# Patient Record
Sex: Female | Born: 1975 | Hispanic: Yes | Marital: Married | Smoking: Never smoker
Health system: Southern US, Community
[De-identification: ages and names within clinical notes are randomized; demographics above are authoritative.]

## PROBLEM LIST (undated history)

## (undated) DIAGNOSIS — O99019 Anemia complicating pregnancy, unspecified trimester: Secondary | ICD-10-CM

## (undated) DIAGNOSIS — O0940 Supervision of pregnancy with grand multiparity, unspecified trimester: Secondary | ICD-10-CM

---
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
## 2004-03-22 ENCOUNTER — Ambulatory Visit: Payer: Self-pay | Admitting: Pediatrics

## 2004-04-04 ENCOUNTER — Ambulatory Visit: Payer: Self-pay | Admitting: Pediatrics

## 2006-05-15 ENCOUNTER — Ambulatory Visit: Payer: Self-pay | Admitting: *Deleted

## 2017-09-24 ENCOUNTER — Ambulatory Visit
Admission: RE | Admit: 2017-09-24 | Discharge: 2017-09-24 | Disposition: A | Discharge status: Home / Self Care | Payer: Self-pay | Source: Ambulatory Visit | Attending: Oncology | Admitting: Oncology

## 2017-09-24 ENCOUNTER — Ambulatory Visit: Payer: Self-pay | Attending: Oncology | Admitting: *Deleted

## 2017-09-24 ENCOUNTER — Other Ambulatory Visit: Payer: Self-pay | Admitting: *Deleted

## 2017-09-24 ENCOUNTER — Encounter (INDEPENDENT_AMBULATORY_CARE_PROVIDER_SITE_OTHER): Payer: Self-pay

## 2017-09-24 VITALS — BP 114/80 | HR 67 | Temp 98.1°F | Ht 64.0 in | Wt 152.0 lb

## 2017-09-24 DIAGNOSIS — N63 Unspecified lump in unspecified breast: Secondary | ICD-10-CM

## 2017-09-24 NOTE — Progress Notes (Signed)
  Subjective:     Patient ID: Leah Banks, female   DOB: 10/26/1975, 42 y.o.   MRN: [id number]  Referred for left breast mass.  Review of Systems     Objective:   Physical Exam  Pulmonary/Chest: Right breast exhibits mass and tenderness. Right breast exhibits no inverted nipple, no nipple discharge and no skin change. Left breast exhibits mass and tenderness. Left breast exhibits no inverted nipple, no nipple discharge and no skin change. No breast swelling, tenderness, discharge or bleeding. Breasts are symmetrical.         Assessment:     42 year old Hispanic female referred to [facility] by Sandrea HughsJessica Rubio, FNP for further evaluation of a 10 cm left breast mass.  Lloyda, the interpreter present during the interview and exam.  Patient's friend, Analena present also per patient request.  Patient states she found the mass on self exam about 2 months ago.  States it is very painful, and more with exertion.  Rates her pain an 8 on a 0/10 scale.  On clinical breast exam there is a palpable firm 2 cm nodule at 10:00 right breast.  The patient states this is tender to palpation.  I can also palpate a very large, very soft, mobile 7 cm mass at 12-2:00 left breast.  This is the area of patient concern.  Taught self breast awareness. Last pap on 06/25/15 was negative without HPV co-testing.  Next pap due in 2020.   Patient has been screened for eligibility.  She does not have any insurance, Medicare or Medicaid.  She also meets financial eligibility.  Hand-out given on the Affordable Care Act.    Plan:     Will get bilateral diagnostic mammogram and ultrasound.  If no findings on imaging will refer for surgical consultation. Will follow-up per [facility] protocol.

## 2017-09-24 NOTE — Patient Instructions (Signed)
Gave patient hand-out, Women Staying Healthy, Active and Well from [facility], with education on breast health, pap smears, heart and colon health. 

## 2017-09-26 ENCOUNTER — Ambulatory Visit
Admission: RE | Admit: 2017-09-26 | Discharge: 2017-09-26 | Disposition: A | Discharge status: Home / Self Care | Payer: Self-pay | Source: Ambulatory Visit | Attending: Oncology | Admitting: Oncology

## 2017-09-26 ENCOUNTER — Encounter: Payer: Self-pay | Admitting: *Deleted

## 2017-09-26 DIAGNOSIS — N63 Unspecified lump in unspecified breast: Secondary | ICD-10-CM | POA: Insufficient documentation

## 2017-09-26 NOTE — Progress Notes (Signed)
Patient returned to the breast center today for cyst aspiration of painful cyst.  Per radiology notes the  cyst decompressed.  Radiology recommends annual screening.  [facility]IS to [place].

## 2018-05-08 ENCOUNTER — Encounter (INDEPENDENT_AMBULATORY_CARE_PROVIDER_SITE_OTHER): Payer: Self-pay

## 2018-05-08 ENCOUNTER — Ambulatory Visit
Admission: RE | Admit: 2018-05-08 | Discharge: 2018-05-08 | Disposition: A | Discharge status: Home / Self Care | Payer: Self-pay | Source: Ambulatory Visit | Attending: Oncology | Admitting: Oncology

## 2018-05-08 ENCOUNTER — Ambulatory Visit: Payer: Self-pay | Attending: Oncology

## 2018-05-08 VITALS — BP 110/70 | HR 70 | Temp 98.3°F | Ht 64.5 in | Wt 149.1 lb

## 2018-05-08 DIAGNOSIS — N63 Unspecified lump in unspecified breast: Secondary | ICD-10-CM

## 2018-05-08 NOTE — Progress Notes (Signed)
  Subjective:     Patient ID: Leah Banks, female   DOB: 07-10-1975, 43 y.o.   MRN: [id number]  HPI   Review of Systems     Objective:   Physical Exam Chest:     Breasts:        Right: Mass and tenderness present. No swelling, bleeding, inverted nipple, nipple discharge or skin change.        Left: Mass and tenderness present. No swelling, bleeding, inverted nipple, nipple discharge or skin change.       Comments: Bilateral generalized breast tenderness; Lump palpated upper outer left breast; Patient palaptes lump right breast upper inner, and lower outer       Assessment:     43 year old hispanic patient referred for left breast lump that she has felt x 1.5 months.  Patient discovered 2 weeks ago that she is pregnant with IUD in place.  She is being followed at [facility] interpreted exam. Patient screened, and meets [facility] eligibility.  Patient does not have insurance, Medicare or Medicaid.  Handout given on Affordable Care Act.Instructed patient on breast self awareness using teach back method.  Bilateral breast tenderness on clinical breast exam.  Palpated mass at upper outer left quadrant.  Patient is feeling mass at lower outer right breast , and upper inner right breast.  Patient is due for bilateral mammogram, and pap in July 2020.  She is instructed to call after birth of child to schedule this [facility] appointment for pap and mammogram. Risk Assessment    No risk assessment data for the current encounter   Risk Scores      09/24/2017   Last edited by: Jim Like, RN   5-year risk: 0.4 %   Lifetime risk: 5.7 %            Plan:     Sent for bilateral breast ultrasound.  Notified Crystal in the [facility] that patient is pregnant, and no mammogram ordered.

## 2018-05-13 NOTE — Progress Notes (Signed)
Letter mailed from [facility] to notify of normal ultrasound results.  Patient to return after birth of for annual screening, and mammogram.  Copy to HSIS.

## 2018-10-10 ENCOUNTER — Other Ambulatory Visit: Payer: Self-pay

## 2018-10-10 ENCOUNTER — Emergency Department: Payer: Self-pay

## 2018-10-10 ENCOUNTER — Encounter: Payer: Self-pay | Admitting: Emergency Medicine

## 2018-10-10 ENCOUNTER — Emergency Department
Admission: EM | Admit: 2018-10-10 | Discharge: 2018-10-10 | Disposition: A | Discharge status: Home / Self Care | Payer: Self-pay | Attending: Emergency Medicine | Admitting: Emergency Medicine

## 2018-10-10 DIAGNOSIS — R0981 Nasal congestion: Secondary | ICD-10-CM | POA: Insufficient documentation

## 2018-10-10 DIAGNOSIS — O99511 Diseases of the respiratory system complicating pregnancy, first trimester: Secondary | ICD-10-CM | POA: Insufficient documentation

## 2018-10-10 DIAGNOSIS — Z20822 Contact with and (suspected) exposure to covid-19: Secondary | ICD-10-CM

## 2018-10-10 DIAGNOSIS — Z3A11 11 weeks gestation of pregnancy: Secondary | ICD-10-CM | POA: Insufficient documentation

## 2018-10-10 DIAGNOSIS — R0602 Shortness of breath: Secondary | ICD-10-CM | POA: Insufficient documentation

## 2018-10-10 DIAGNOSIS — Z20828 Contact with and (suspected) exposure to other viral communicable diseases: Secondary | ICD-10-CM | POA: Insufficient documentation

## 2018-10-10 DIAGNOSIS — R430 Anosmia: Secondary | ICD-10-CM | POA: Insufficient documentation

## 2018-10-10 MED ORDER — ALBUTEROL SULFATE HFA 108 (90 BASE) MCG/ACT IN AERS
2.0000 | INHALATION_SPRAY | RESPIRATORY_TRACT | Status: DC | PRN
  Administered 2018-10-10: 20:00:00 2 via RESPIRATORY_TRACT
  Filled 2018-10-10: qty 6.7

## 2018-10-10 NOTE — ED Triage Notes (Addendum)
Patient presents to the ED with covid19 symptoms that began 3 days ago. (hospital spanish interpreter used for triage.).  Patient states she started having symptoms 3 days ago.  Patient denies any known covid19 contacts.  Patient is [redacted] weeks pregnant and states she is concerned the baby is not getting enough oxygen due to her shortness of breath.  Patient reports cough, shortness of breath and loss of smell.

## 2018-10-10 NOTE — ED Provider Notes (Addendum)
[facility] Emergency Department Provider Note   ____________________________________________   I have reviewed the triage vital signs and the nursing notes.   HISTORY  Chief Complaint Shortness of Breath   History limited by: [place] utilized   HPI Leah Banks is a 43 y.o. female who presents to the emergency department today because of concern for shortness of breath. The patient states that for the past 3 days she has been having congestion. Has also had loss of smell. Has had cough with the shortness of breath. Had a covid swab done earlier today. Patient says she is roughly [redacted] weeks pregnant.   Records reviewed.  History reviewed. No pertinent past medical history.  There are no active problems to display for this patient.   History reviewed. No pertinent surgical history.  Prior to Admission medications   Not on File    Allergies Patient has no known allergies.  Family History  Problem Relation Age of Onset  . Breast cancer Neg Hx     Social History Social History   Tobacco Use  . Smoking status: Never Smoker  . Smokeless tobacco: Never Used  Substance Use Topics  . Alcohol use: Not on file  . Drug use: Not on file    Review of Systems Constitutional: No fever/chills Eyes: No visual changes. ENT: Positive for decreased taste Cardiovascular: Denies chest pain. Respiratory: Positive for shortness of breath. Positive for cough.  Gastrointestinal: No abdominal pain.  No nausea, no vomiting.  No diarrhea.   Genitourinary: Negative for dysuria. Musculoskeletal: Negative for back pain. Skin: Negative for rash. Neurological: Negative for headaches, focal weakness or numbness.  ____________________________________________   PHYSICAL EXAM:  VITAL SIGNS: ED Triage Vitals  Enc Vitals Group     BP 10/10/18 1901 119/70     Pulse Rate 10/10/18 1901 78     Resp 10/10/18 1901 18     Temp  10/10/18 1901 99.1 F (37.3 C)     Temp Source 10/10/18 1901 Oral     SpO2 10/10/18 1901 98 %     Weight 10/10/18 1902 150 lb (68 kg)     Height 10/10/18 1902 5\' 2"  (1.575 m)     Head Circumference --      Peak Flow --      Pain Score 10/10/18 1901 8   Constitutional: Alert and oriented.  Eyes: Conjunctivae are normal.  ENT      Head: Normocephalic and atraumatic.      Nose: No congestion/rhinnorhea.      Mouth/Throat: Mucous membranes are moist.      Neck: No stridor. Hematological/Lymphatic/Immunilogical: No cervical lymphadenopathy. Cardiovascular: Normal rate, regular rhythm.  No murmurs, rubs, or gallops.  Respiratory: Tachypnea. No wheezes/rales/rhonchi. Gastrointestinal: Soft and non tender. No rebound. No guarding.  Genitourinary: Deferred Musculoskeletal: Normal range of motion in all extremities. No lower extremity edema. Neurologic:  Normal speech and language. No gross focal neurologic deficits are appreciated.  Skin:  Skin is warm, dry and intact. No rash noted. Psychiatric: Mood and affect are normal. Speech and behavior are normal. Patient exhibits appropriate insight and judgment.  ____________________________________________    LABS (pertinent positives/negatives)  None  ____________________________________________   EKG  I, Nance Pear, attending physician, personally viewed and interpreted this EKG  EKG Time: 1858 Rate: 79 Rhythm: normal sinus rhythm Axis: normal Intervals: qtc 426 QRS: narrow ST changes: no st elevation Impression: normal ekg   ____________________________________________    RADIOLOGY  CXR Consistent  with atypical/viral infection  ____________________________________________   PROCEDURES  Procedures  ____________________________________________   INITIAL IMPRESSION / ASSESSMENT AND PLAN / ED COURSE  Pertinent labs & imaging results that were available during my care of the patient were reviewed by me and  considered in my medical decision making (see chart for details).   Patient presented to the emergency department today because of concern for shortness of breath. Patient has other symptoms consistent with COVID. Was tested earlier today. Patient without oxygen requirement at this time. Will give patient albuterol inhaler. Discussed symptomatic care.    ____________________________________________   FINAL CLINICAL IMPRESSION(S) / ED DIAGNOSES  Final diagnoses:  Shortness of breath  Suspected Covid-19 Virus Infection     Note: This dictation was prepared with Dragon dictation. Any transcriptional errors that result from this process are unintentional     Phineas SemenGoodman, Heraclio Seidman, MD 10/10/18 2113    Phineas SemenGoodman, Krystena Reitter, MD 10/26/18 [phone]

## 2018-10-10 NOTE — Discharge Instructions (Addendum)
Please seek medical attention for any high fevers, chest pain, shortness of breath, change in behavior, persistent vomiting, bloody stool or any other new or concerning symptoms.  

## 2018-10-13 LAB — NOVEL CORONAVIRUS, NAA: SARS-CoV-2, NAA: DETECTED — AB

## 2018-10-28 ENCOUNTER — Other Ambulatory Visit: Payer: Self-pay | Admitting: Family Medicine

## 2018-10-28 ENCOUNTER — Ambulatory Visit: Payer: Medicaid Other | Admitting: Nurse Practitioner

## 2018-10-28 ENCOUNTER — Encounter: Payer: Self-pay | Admitting: Nurse Practitioner

## 2018-10-28 ENCOUNTER — Other Ambulatory Visit: Payer: Self-pay

## 2018-10-28 VITALS — BP 103/69 | Temp 97.7°F | Wt 151.4 lb

## 2018-10-28 DIAGNOSIS — O99012 Anemia complicating pregnancy, second trimester: Secondary | ICD-10-CM

## 2018-10-28 DIAGNOSIS — O09529 Supervision of elderly multigravida, unspecified trimester: Secondary | ICD-10-CM

## 2018-10-28 DIAGNOSIS — Z3480 Encounter for supervision of other normal pregnancy, unspecified trimester: Secondary | ICD-10-CM

## 2018-10-28 LAB — URINALYSIS
Bilirubin, UA: NEGATIVE
Glucose, UA: NEGATIVE
Ketones, UA: NEGATIVE
Leukocytes,UA: NEGATIVE
Nitrite, UA: NEGATIVE
Protein,UA: NEGATIVE
RBC, UA: NEGATIVE
Specific Gravity, UA: 1.025 (ref 1.005–1.030)
Urobilinogen, Ur: 0.2 mg/dL (ref 0.2–1.0)
pH, UA: 5.5 (ref 5.0–7.5)

## 2018-10-28 LAB — WET PREP FOR TRICH, YEAST, CLUE
Trichomonas Exam: NEGATIVE
Yeast Exam: NEGATIVE

## 2018-10-28 LAB — OB RESULTS CONSOLE HIV ANTIBODY (ROUTINE TESTING): HIV: NONREACTIVE

## 2018-10-28 LAB — HEMOGLOBIN, FINGERSTICK: Hemoglobin: 10.5 g/dL — ABNORMAL LOW (ref 11.1–15.9)

## 2018-10-28 MED ORDER — FERROUS SULFATE 324 (65 FE) MG PO TBEC: 1.0000 | DELAYED_RELEASE_TABLET | Freq: Every day | ORAL | 0 refills | Status: DC

## 2018-10-28 NOTE — Progress Notes (Signed)
Here today for a new Ob appt. Denies current s/s or exposure to Covid-19. Taking PNV QD, denies ED/hospital visits since +PT. Hal Morales, RN  Patient treated for Anemia per standing order. Hal Morales, RN

## 2018-10-29 LAB — 789231 7+OXYCODONE-BUND
Amphetamines, Urine: NEGATIVE ng/mL
BENZODIAZ UR QL: NEGATIVE ng/mL
Barbiturate screen, urine: NEGATIVE ng/mL
Cannabinoid Quant, Ur: NEGATIVE ng/mL
Cocaine (Metab.): NEGATIVE ng/mL
OPIATE SCREEN URINE: NEGATIVE ng/mL
Oxycodone/Oxymorphone, Urine: NEGATIVE ng/mL
PCP Quant, Ur: NEGATIVE ng/mL

## 2018-10-30 DIAGNOSIS — O99019 Anemia complicating pregnancy, unspecified trimester: Secondary | ICD-10-CM

## 2018-10-30 DIAGNOSIS — Z3480 Encounter for supervision of other normal pregnancy, unspecified trimester: Secondary | ICD-10-CM | POA: Insufficient documentation

## 2018-10-30 HISTORY — DX: Anemia complicating pregnancy, unspecified trimester: O99.019

## 2018-10-30 LAB — QUANTIFERON-TB GOLD PLUS
QuantiFERON Mitogen Value: 0.64 IU/mL
QuantiFERON Nil Value: 0.07 IU/mL
QuantiFERON TB1 Ag Value: 0.06 IU/mL
QuantiFERON TB2 Ag Value: 0.07 IU/mL
QuantiFERON-TB Gold Plus: NEGATIVE

## 2018-10-30 LAB — CBC/D/PLT+RPR+RH+ABO+RUB AB...
Antibody Screen: NEGATIVE
Basophils Absolute: 0 10*3/uL (ref 0.0–0.2)
Basos: 1 %
EOS (ABSOLUTE): 0.2 10*3/uL (ref 0.0–0.4)
Eos: 3 %
Hematocrit: 31.2 % — ABNORMAL LOW (ref 34.0–46.6)
Hemoglobin: 10.8 g/dL — ABNORMAL LOW (ref 11.1–15.9)
Hepatitis B Surface Ag: NEGATIVE
Immature Grans (Abs): 0 10*3/uL (ref 0.0–0.1)
Immature Granulocytes: 0 %
Lymphocytes Absolute: 1.1 10*3/uL (ref 0.7–3.1)
Lymphs: 15 %
MCH: 30.7 pg (ref 26.6–33.0)
MCHC: 34.6 g/dL (ref 31.5–35.7)
MCV: 89 fL (ref 79–97)
Monocytes Absolute: 0.3 10*3/uL (ref 0.1–0.9)
Monocytes: 5 %
Neutrophils Absolute: 5.5 10*3/uL (ref 1.4–7.0)
Neutrophils: 76 %
Platelets: 218 10*3/uL (ref 150–450)
RBC: 3.52 x10E6/uL — ABNORMAL LOW (ref 3.77–5.28)
RDW: 13 % (ref 11.7–15.4)
RPR Ser Ql: NONREACTIVE
Rh Factor: POSITIVE
Rubella Antibodies, IGG: 17.5 index (ref >0.99)
Varicella zoster IgG: 2039 index (ref >165)
WBC: 7.2 10*3/uL (ref 3.4–10.8)

## 2018-10-30 LAB — HGB FRAC. W/SOLUBILITY
Hgb A2 Quant: 2.5 % (ref 1.8–3.2)
Hgb A: 96.7 % (ref 96.4–98.8)
Hgb C: 0 %
Hgb F Quant: 0.8 % (ref 0.0–2.0)
Hgb S: 0 %
Hgb Solubility: NEGATIVE
Hgb Variant: 0 %

## 2018-10-30 LAB — URINE CULTURE: Organism ID, Bacteria: NO GROWTH

## 2018-10-30 NOTE — Progress Notes (Signed)
[facility] [address] [place] [address] [place] [id number] [phone]  INITIAL PRENATAL VISIT NOTE  Subjective:  Leah Banks is a 43 y.o. [id number] at 4921w5d being seen today to start prenatal care at [facility].  She is currently monitored for the following issues for this high-risk pregnancy and has Anemia in pregnancy and Supervision of other normal pregnancy, antepartum on their problem list.  Patient reports no complaints.  Contractions: Not present. Vag. Bleeding: None.  Movement: Present. Denies leaking of fluid.   The following portions of the patient's history were reviewed and updated as appropriate: allergies, current medications, past family history, past medical history, past social history, past surgical history and problem list. Problem list updated.  Objective:   Vitals:   10/28/18 0901  BP: 103/69  Temp: 97.7 F (36.5 C)  Weight: 151 lb 6.4 oz (68.7 kg)    Fetal Status: Fetal Heart Rate (bpm): 150   Movement: Present     Physical Exam Vitals signs and nursing note reviewed.  Constitutional:      General: She is not in acute distress.    Appearance: Normal appearance. She is well-developed.  HENT:     Head: Normocephalic and atraumatic.     Mouth/Throat:     Lips: Pink.     Mouth: Mucous membranes are moist.     Dentition: Normal dentition. No dental caries.  Neck:     Thyroid: No thyroid mass or thyromegaly.  Cardiovascular:     Rate and Rhythm: Normal rate.  Pulmonary:     Effort: Pulmonary effort is normal.     Breath sounds: Normal breath sounds.  Chest:     Breasts: Breasts are symmetrical.        Right: Normal. No mass, nipple discharge or skin change.        Left: Normal. No mass, nipple discharge or skin change.  Abdominal:     General: Abdomen is flat.     Palpations: Abdomen is soft.     Tenderness: There is no abdominal tenderness.   Comments: Gravid   Genitourinary:    General: Normal vulva.     Exam position: Lithotomy position.     Pubic Area: No rash.      Labia:        Right: No rash.        Left: No rash.      Vagina: Normal. No vaginal discharge.     Cervix: No cervical motion tenderness or friability.     Uterus: Normal. Enlarged (1-2 FB above umbilicus). Not tender.      Adnexa: Right adnexa normal and left adnexa normal.     Rectum: Normal. No external hemorrhoid.  Lymphadenopathy:     Upper Body:     Right upper body: No axillary adenopathy.     Left upper body: No axillary adenopathy.  Skin:    General: Skin is warm.     Capillary Refill: Capillary refill takes less than 2 seconds.  Neurological:     Mental Status: She is alert.     Assessment and Plan:  Pregnancy: [id number] at 6121w5d  1. Advanced maternal age in multigravida, unspecified trimester Client doing well Denies any concerns or questions at this time  - Urinalysis - HGB FRAC. W/SOLUBILITY - HIV [place] [facility] - Prenatal Profile with Varicella - Urine Culture & Sensitivity - [id number] Drug Screen - Chlamydia/GC NAA, Confirmation - QuantiFERON-TB Gold Plus -  WET PREP FOR TRICH, YEAST, CLUE - Hemoglobin, fingerstick - ferrous sulfate 324 (65 Fe) MG TBEC; Take 1 tablet (325 mg total) by mouth daily.  Dispense: 100 tablet; Refill: 0  2. Anemia during pregnancy in second trimester Plan to continue to monitor - ferrous sulfate 324 (65 Fe) MG TBEC; Take 1 tablet (325 mg total) by mouth daily.  Dispense: 100 tablet; Refill: 0 - Hemoglobin, venipuncture      Discussed overview of care and coordination with inpatient delivery practices including [facility], [facility], [facility] and [facility].   Reviewed Centering pregnancy as standard of care at [facility], oriented to room and showed video. Based on EDD, plan for Cycle  - discussed Zoom pregnancy .    Preterm labor symptoms and general obstetric precautions including but not limited to  vaginal bleeding, contractions, leaking of fluid and fetal movement were reviewed in detail with the patient.  Please refer to After Visit Summary for other counseling recommendations.   No follow-ups on file.  Future Appointments  Date Time Provider [place]  11/25/2018  9:00 AM [facility] PROVIDER [facility] None    Berniece Andreas, NP

## 2018-11-01 DIAGNOSIS — Z6720 Type B blood, Rh positive: Secondary | ICD-10-CM | POA: Insufficient documentation

## 2018-11-02 LAB — CHLAMYDIA/GC NAA, CONFIRMATION
Chlamydia trachomatis, NAA: NEGATIVE
Neisseria gonorrhoeae, NAA: NEGATIVE

## 2018-11-25 ENCOUNTER — Encounter: Payer: Self-pay | Admitting: Advanced Practice Midwife

## 2018-11-25 ENCOUNTER — Other Ambulatory Visit: Payer: Self-pay

## 2018-11-25 ENCOUNTER — Ambulatory Visit: Payer: Self-pay

## 2018-11-25 ENCOUNTER — Ambulatory Visit: Payer: Medicaid Other | Admitting: Advanced Practice Midwife

## 2018-11-25 DIAGNOSIS — Z3480 Encounter for supervision of other normal pregnancy, unspecified trimester: Secondary | ICD-10-CM

## 2018-11-25 DIAGNOSIS — O09529 Supervision of elderly multigravida, unspecified trimester: Secondary | ICD-10-CM | POA: Insufficient documentation

## 2018-11-25 DIAGNOSIS — O99012 Anemia complicating pregnancy, second trimester: Secondary | ICD-10-CM

## 2018-11-25 NOTE — Progress Notes (Signed)
   TELEPHONE OBSTETRICS VISIT ENCOUNTER NOTE  I connected withEsmeralda Elnora Banks on 11/25/18 at  9:00 AM EDT by telephone at home and verified that I am speaking with the correct person using two identifiers.   I discussed the limitations, risks, security and privacy concerns of performing an evaluation and management service by telephone and the availability of in person appointments. I also discussed with the patient that there may be a patient responsible charge related to this service. The patient expressed understanding and agreed to proceed.  Subjective:  Leah Banks is a 43 y.o. [phone] at [redacted]w[redacted]d being followed for ongoing prenatal care.  She is currently monitored for the following issues for this high-risk pregnancy and has Anemia in pregnancy; Supervision of other normal pregnancy, antepartum; Type B blood, Rh positive; and Advanced maternal age in multigravida, unspecified trimester on their problem list.  Patient reports no complaints. Reports fetal movement. Denies any contractions, bleeding or leaking of fluid.   The following portions of the patient's history were reviewed and updated as appropriate: allergies, current medications, past family history, past medical history, past social history, past surgical history and problem list.   Objective:   General:  Alert, oriented and cooperative.   Mental Status: Normal mood and affect perceived. Normal judgment and thought content.  Rest of physical exam deferred due to type of encounter  Assessment and Plan:  Pregnancy: [id number] at [redacted]w[redacted]d 1. Anemia during pregnancy in second trimester Taking I FeSo4 daily with water--counseled to take with juice  2. Supervision of other normal pregnancy, antepartum Feels well.  Not employed.  Reviewed 11/05/18 u/s with EDC=04/25/19.  Using LMP EDC of 05/02/19 per u/s.    3. Advanced maternal age in multigravida, unspecified trimester 43 yo desires Quad screen scheduled 11/27/18.  F/U  u/s for anatomy ordered.  Preterm labor symptoms and general obstetric precautions including but not limited to vaginal bleeding, contractions, leaking of fluid and fetal movement were reviewed in detail with the patient.  I discussed the assessment and treatment plan with the patient. The patient was provided an opportunity to ask questions and all were answered. The patient agreed with the plan and demonstrated an understanding of the instructions. The patient was advised to call back or seek an in-person office evaluation/go to the hospital for any urgent or concerning symptoms.  Please refer to After Visit Summary for other counseling recommendations.   I provided 5 minutes of non-face-to-face time during this encounter.  Return in about 4 weeks (around 12/23/2018) for routine PNC.  Future Appointments  Date Time Provider [place]  11/27/2018  9:40 AM [facility] NURSE AC-[facility] None  12/23/2018 10:00 AM [facility] PROVIDER AC-[facility] None    Herbie Saxon, CNM

## 2018-11-25 NOTE — Progress Notes (Signed)
Call for Telehealth visit; confirmed identity;  Reports taking Fe & PNV; discussed Quad screening-desires appt.-scheduled 11/27/18 for lab Leah Banks, [place]

## 2018-11-27 ENCOUNTER — Other Ambulatory Visit: Payer: Medicaid Other

## 2018-11-27 ENCOUNTER — Other Ambulatory Visit: Payer: Self-pay

## 2018-11-27 DIAGNOSIS — Z3480 Encounter for supervision of other normal pregnancy, unspecified trimester: Secondary | ICD-10-CM

## 2018-11-27 NOTE — Progress Notes (Signed)
Pt went straight to lab after check in, had QUAD screen drawn and left [facility]. Aileen Fass, RN

## 2018-12-04 ENCOUNTER — Telehealth: Payer: Self-pay | Admitting: Advanced Practice Midwife

## 2018-12-04 NOTE — Telephone Encounter (Signed)
T/C to pt to inform her of abnormal Quad screen + high risk for Down's 1:142 (risk is lower than age risk) at 52 5/7 by u/s.  Recommend genetic counseling, comprehensive u/s, and possible amnio.  Pt states she has u/s on 12/20/18 at [facility]

## 2018-12-11 ENCOUNTER — Encounter: Payer: Self-pay | Admitting: Advanced Practice Midwife

## 2018-12-11 DIAGNOSIS — O4400 Placenta previa specified as without hemorrhage, unspecified trimester: Secondary | ICD-10-CM | POA: Insufficient documentation

## 2018-12-12 NOTE — Addendum Note (Signed)
Addended by: Laquilla Dault on: 12/12/2018 11:33 AM   Modules accepted: Orders  

## 2018-12-23 ENCOUNTER — Encounter: Payer: Self-pay | Admitting: Family Medicine

## 2018-12-23 ENCOUNTER — Ambulatory Visit: Payer: Medicaid Other | Admitting: Advanced Practice Midwife

## 2018-12-23 ENCOUNTER — Other Ambulatory Visit: Payer: Self-pay

## 2018-12-23 VITALS — BP 90/57 | Temp 99.1°F | Wt 160.6 lb

## 2018-12-23 DIAGNOSIS — O285 Abnormal chromosomal and genetic finding on antenatal screening of mother: Secondary | ICD-10-CM | POA: Insufficient documentation

## 2018-12-23 DIAGNOSIS — Z23 Encounter for immunization: Secondary | ICD-10-CM

## 2018-12-23 DIAGNOSIS — Z3480 Encounter for supervision of other normal pregnancy, unspecified trimester: Secondary | ICD-10-CM

## 2018-12-23 DIAGNOSIS — O99012 Anemia complicating pregnancy, second trimester: Secondary | ICD-10-CM

## 2018-12-23 DIAGNOSIS — Z6720 Type B blood, Rh positive: Secondary | ICD-10-CM | POA: Diagnosis not present

## 2018-12-23 LAB — HEMOGLOBIN, FINGERSTICK: Hemoglobin: 10.3 g/dL — ABNORMAL LOW (ref 11.1–15.9)

## 2018-12-23 NOTE — Addendum Note (Signed)
Addended by: Debera Lat on: 12/23/2018 02:13 PM   Modules accepted: Orders

## 2018-12-23 NOTE — Addendum Note (Signed)
Addended by: Jenetta Downer on: 12/23/2018 02:10 PM   Modules accepted: Orders

## 2018-12-23 NOTE — Progress Notes (Signed)
Patient here for [place] RV at 21 3/7 weeks. CCNC and PHQ9 today. Needs Hgb checked today. Flu vaccine given today, left deltoid, tolerated well, VIS given. Patient states she U/S was changed from 12/20/2018 to 10/15 or 01/10/2019. Patient has information written down at home.Jenetta Downer, RN

## 2018-12-23 NOTE — Progress Notes (Signed)
   PRENATAL VISIT NOTE  Subjective:  Leah Banks is a 43 y.o. [phone] at [redacted]w[redacted]d being seen today for ongoing prenatal care.  She is currently monitored for the following issues for this low-risk pregnancy and has Anemia in pregnancy; Supervision of other normal pregnancy, antepartum; Type B blood, Rh positive; Advanced maternal age in multigravida, unspecified trimester; and  placenta previa on 12/11/18 u/s at 34 5/7--covers internal os by 3 cm on their problem list.  Patient reports no complaints.  Contractions: Not present. Vag. Bleeding: None.  Movement: Present.  Denies leaking of fluid/ROM.   The following portions of the patient's history were reviewed and updated as appropriate: allergies, current medications, past family history, past medical history, past social history, past surgical history and problem list. Problem list updated.  Objective:   Vitals:   12/23/18 1000  BP: (!) 90/57  Temp: 99.1 F (37.3 C)  Weight: 160 lb 9.6 oz (72.8 kg)    Fetal Status: Fetal Heart Rate (bpm): 150 Fundal Height: 23 cm Movement: Present     General:  Alert, oriented and cooperative. Patient is in no acute distress.  Skin: Skin is warm and dry. No rash noted.   Cardiovascular: Normal heart rate noted  Respiratory: Normal respiratory effort, no problems with respiration noted  Abdomen: Soft, gravid, appropriate for gestational age.  Pain/Pressure: Absent     Pelvic: Cervical exam deferred        Extremities: Normal range of motion.  Edema: None  Mental Status: Normal mood and affect. Normal behavior. Normal judgment and thought content.   Assessment and Plan:  Pregnancy: [id number] at [redacted]w[redacted]d    1. Anemia during pregnancy in second trimester Taking Feso4 I daily with juice  3. Supervision of other normal pregnancy, antepartum Feels well. 15 lb 9.6 oz (7.076 kg) Had u/s rescheduled to 12/11/18 with previa--needs f/u u/s 30-32 wks - Flu Vaccine QUAD 36+ mos IM  4. Flu vaccine  need  - Flu Vaccine QUAD 36+ mos IM   Preterm labor symptoms and general obstetric precautions including but not limited to vaginal bleeding, contractions, leaking of fluid and fetal movement were reviewed in detail with the patient. Please refer to After Visit Summary for other counseling recommendations.  Return in about 4 weeks (around 01/20/2019).  No future appointments.  Herbie Saxon, CNM

## 2019-01-20 ENCOUNTER — Other Ambulatory Visit: Payer: Self-pay

## 2019-01-20 ENCOUNTER — Ambulatory Visit: Payer: Self-pay | Admitting: Advanced Practice Midwife

## 2019-01-20 ENCOUNTER — Ambulatory Visit: Payer: Self-pay

## 2019-01-20 VITALS — BP 94/62 | Temp 98.1°F | Wt 169.2 lb

## 2019-01-20 DIAGNOSIS — O4400 Placenta previa specified as without hemorrhage, unspecified trimester: Secondary | ICD-10-CM

## 2019-01-20 DIAGNOSIS — O09529 Supervision of elderly multigravida, unspecified trimester: Secondary | ICD-10-CM

## 2019-01-20 DIAGNOSIS — Z3480 Encounter for supervision of other normal pregnancy, unspecified trimester: Secondary | ICD-10-CM

## 2019-01-20 DIAGNOSIS — O285 Abnormal chromosomal and genetic finding on antenatal screening of mother: Secondary | ICD-10-CM

## 2019-01-20 LAB — HEMOGLOBIN, FINGERSTICK: Hemoglobin: 10.6 g/dL — ABNORMAL LOW (ref 11.1–15.9)

## 2019-01-20 MED ORDER — FERROUS SULFATE 324 (65 FE) MG PO TBEC: 1.0000 | DELAYED_RELEASE_TABLET | Freq: Every day | ORAL | 0 refills | Status: AC

## 2019-01-20 NOTE — Progress Notes (Signed)
Patient here for MH RV at 25 3/7. S/S PTL reviewed, handout given. CCNC and PHQ9 today.Jenetta Downer, RN

## 2019-01-20 NOTE — Progress Notes (Signed)
More iron given today. Needs Hgb check today.Jenetta Downer, RN

## 2019-01-20 NOTE — Progress Notes (Signed)
   PRENATAL VISIT NOTE  Subjective:  Leah Banks is a 43 y.o. [phone] at [redacted]w[redacted]d being seen today for ongoing prenatal care.  She is currently monitored for the following issues for this high-risk pregnancy and has Supervision of other normal pregnancy, antepartum; Type B blood, Rh positive; Advanced maternal age in multigravida, unspecified trimester 43 yo;  placenta previa on 12/11/18 u/s at 21 5/7--covers internal os by 3 cm; and Abnormal chromosomal and genetic finding on antenatal screening mother on their problem list.  Patient reports no complaints.  Contractions: Not present. Vag. Bleeding: None.  Movement: Present. Denies leaking of fluid/ROM.   The following portions of the patient's history were reviewed and updated as appropriate: allergies, current medications, past family history, past medical history, past social history, past surgical history and problem list. Problem list updated.  Objective:   Vitals:   01/20/19 1026  BP: 94/62  Temp: 98.1 F (36.7 C)  Weight: 169 lb 3.2 oz (76.7 kg)    Fetal Status: Fetal Heart Rate (bpm): 160 Fundal Height: 28 cm Movement: Present     General:  Alert, oriented and cooperative. Patient is in no acute distress.  Skin: Skin is warm and dry. No rash noted.   Cardiovascular: Normal heart rate noted  Respiratory: Normal respiratory effort, no problems with respiration noted  Abdomen: Soft, gravid, appropriate for gestational age.  Pain/Pressure: Absent     Pelvic: Cervical exam deferred        Extremities: Normal range of motion.  Edema: None  Mental Status: Normal mood and affect. Normal behavior. Normal judgment and thought content.   Assessment and Plan:  Pregnancy: [id number] at [redacted]w[redacted]d  1. Placenta previa antepartum By 20 5/7 wk u/s.  Ordered f/u u/s at 32 wks Taking FeS04 I daily with oj.  Not working.  Feels well - Hemoglobin, fingerstick - ferrous sulfate 324 (65 Fe) MG TBEC; Take 1 tablet (325 mg total) by mouth daily.   Dispense: 100 tablet; Refill: 0  2. Advanced maternal age in multigravida, unspecified trimester 43 yo 43 yo  3. Abnormal chromosomal and genetic finding on antenatal screening mother + Quad screen--pt declined further testing   Preterm labor symptoms and general obstetric precautions including but not limited to vaginal bleeding, contractions, leaking of fluid and fetal movement were reviewed in detail with the patient. Please refer to After Visit Summary for other counseling recommendations.  Return in about 3 weeks (around 02/10/2019).  No future appointments.  Herbie Saxon, CNM

## 2019-01-20 NOTE — Progress Notes (Signed)
Hgb checked today, patient counseled to continue taking iron once each day with vitamin C juice. [facility] U/S referral faxed and patient told to expect a call from [facility] scheduling to set a date. Patient to return 2-3 weeks for 28 week labs.Jenetta Downer, RN

## 2019-02-10 ENCOUNTER — Ambulatory Visit: Payer: Self-pay

## 2019-02-10 ENCOUNTER — Other Ambulatory Visit: Payer: Self-pay

## 2019-02-10 ENCOUNTER — Encounter: Payer: Self-pay | Admitting: Family Medicine

## 2019-02-10 VITALS — BP 89/58 | Temp 98.8°F | Wt 176.8 lb

## 2019-02-10 DIAGNOSIS — Z3480 Encounter for supervision of other normal pregnancy, unspecified trimester: Secondary | ICD-10-CM

## 2019-02-10 DIAGNOSIS — O4403 Placenta previa specified as without hemorrhage, third trimester: Secondary | ICD-10-CM

## 2019-02-10 DIAGNOSIS — Z23 Encounter for immunization: Secondary | ICD-10-CM

## 2019-02-10 DIAGNOSIS — M533 Sacrococcygeal disorders, not elsewhere classified: Secondary | ICD-10-CM

## 2019-02-10 DIAGNOSIS — O99013 Anemia complicating pregnancy, third trimester: Secondary | ICD-10-CM

## 2019-02-10 LAB — HEMOGLOBIN, FINGERSTICK: Hemoglobin: 11.5 g/dL (ref 11.1–15.9)

## 2019-02-10 LAB — OB RESULTS CONSOLE HIV ANTIBODY (ROUTINE TESTING): HIV: NONREACTIVE

## 2019-02-10 NOTE — Progress Notes (Signed)
   PRENATAL VISIT NOTE  Subjective:  Leah Banks is a 43 y.o. [phone] at [redacted]w[redacted]d being seen today for ongoing prenatal care.  She is currently monitored for the following issues for this low-risk pregnancy and has Anemia in pregnancy; Supervision of other normal pregnancy, antepartum; Type B blood, Rh positive; Advanced maternal age in multigravida, unspecified trimester 43 yo;  placenta previa on 12/11/18 u/s at 55 5/7--covers internal os by 3 cm; and Abnormal chromosomal and genetic finding on antenatal screening mother on their problem list.  Patient reports tailbone pain, worse with sitting long periods of time.  Contractions: Not present. Vag. Bleeding: None.  Movement: Present. Denies leaking of fluid/ROM.   The following portions of the patient's history were reviewed and updated as appropriate: allergies, current medications, past family history, past medical history, past social history, past surgical history and problem list. Problem list updated.  Objective:   Vitals:   02/10/19 1000  BP: (!) 89/58  Temp: 98.8 F (37.1 C)  Weight: 176 lb 12.8 oz (80.2 kg)    Fetal Status: Fetal Heart Rate (bpm): 150 Fundal Height: 29 cm Movement: Present     General:  Alert, oriented and cooperative. Patient is in no acute distress.  Skin: Skin is warm and dry. No rash noted.   Cardiovascular: Normal heart rate noted  Respiratory: Normal respiratory effort, no problems with respiration noted  Abdomen: Soft, gravid, appropriate for gestational age.  Pain/Pressure: Absent     Pelvic: Cervical exam deferred        Extremities: Normal range of motion.  Edema: None  Mental Status: Normal mood and affect. Normal behavior. Normal judgment and thought content.   Assessment and Plan:  Pregnancy: [id number] at [redacted]w[redacted]d  1. Supervision of other normal pregnancy, antepartum -Up to date. 28 wk labs today - Glucose Tolerance, 1 Hour - RPR - HIV [place] [facility] - Hemoglobin, venipuncture  2.  Placenta previa in third trimester -No bleeding, Has follow up u/s scheduled w/[facility] on 03/05/19  3. Coccyx pain -Advised cushioned seat, baths, tylenol -Handout given  4. Anemia during pregnancy in third trimester -She continues to take iron w/OJ once daily. Will recheck iron today.  Preterm labor symptoms and general obstetric precautions including but not limited to vaginal bleeding, contractions, leaking of fluid and fetal movement were reviewed in detail with the patient. Please refer to After Visit Summary for other counseling recommendations.  Return in about 2 weeks (around 02/24/2019) for routine prenatal care.  Future Appointments  Date Time Provider [place]  02/24/2019  9:00 AM AC-MH PROVIDER AC-MAT None    Kandee Keen, PA-C

## 2019-02-10 NOTE — Progress Notes (Addendum)
Patient here for MH RV at 28 3/7. 28 week labs today, needs Tdap and 1 hr. Gtt today. Patient complaining of pain in sacrum when she stands from sitting. Lasts 2-3 minutes. Tdap given, left deltoid, tolerated well, VIS given. Patient needs to be to lab by 10:50am for 11:04 blood draw. Patient would like to use IUD for [facility], but states she got pregnant this time while she had Paraguard.Jenetta Downer, RN

## 2019-02-10 NOTE — Patient Instructions (Signed)
Lesin en el coxis Tailbone Injury El coxis es el hueso pequeo que se encuentra en el extremo inferior de la columna vertebral. El coxis puede lesionarse debido a:  Engineer, manufacturing.  Estar sentado para Product/process development scientist o andar en bicicleta durante mucho tiempo.  Tener un beb. Este tipo de lesin puede ser dolorosa. La mayora de las lesiones en el coxis mejoran por s solas en el trmino de 4 a 6semanas. Siga estas indicaciones en su casa: Actividad  Government social research officer sentado en la misma posicin durante [employer].  Use protectores y equipo deportivo adecuado cuando ande en bicicleta o reme.  Aumente la actividad a medida que el dolor se lo permita.  Haga ejercicios como se lo hayan indicado el mdico o el fisioterapeuta. Control del dolor, la rigidez y la hinchazn  Para aliviar el dolor: ? Sintese sobre un aro o un almohadn grande de goma o inflable. ? Inclnese hacia adelante al sentarse.  Si se lo indican, aplique hielo sobre la zona lesionada. ? Ponga el hielo en una bolsa plstica. ? Coloque una [employer] piel y la bolsa de hielo. ? Coloque el hielo durante 68minutos, de 2a3veces por da. Hgalo durante los primeros 1 o 2das.  Aplique calor sobre la zona lesionada, si se lo indican. Hgalo con la frecuencia que le haya indicado el mdico. Use la fuente de calor que el mdico le recomiende, como una compresa de calor hmedo o una almohadilla trmica. ? Coloque una [employer] piel y la fuente de Freight forwarder. ? Aplique el calor durante 20 a 20minutos. ? Retire la fuente de calor si la piel se pone de color rojo brillante. Esto es muy importante si no puede Education officer, environmental, calor o fro. Puede correr un riesgo mayor de sufrir quemaduras. Instrucciones generales  Delphi de venta libre y los recetados solamente como se lo haya indicado el mdico.  Para prevenir o tratar problemas para defecar (estreimiento) o dolor al defecar, el mdico puede indicarle que: ?  Beba suficiente lquido para Theatre manager la orina de color amarillo plido. ? Coma alimentos ricos en fibra. Entre ellos, frutas y verduras frescas, cereales integrales y frijoles. ? Limite los alimentos con alto contenido de [place] y Location manager. Estos incluyen alimentos fritos y dulces. ? Tome un medicamento de venta libre o recetado a fin de tratar el problema para defecar.  Concurra a todas las visitas de seguimiento como se lo haya indicado el mdico. Esto es importante. Comunquese con un mdico si:  El Holiday representative.  Defecar le Nurse, mental health.  No puede defecar luego de 4 das.  Siente dolor durante las [employer]. Resumen  Una lesin en el coxis puede ocurrir por una cada, por estar sentado durante mucho tiempo para remar o Catering manager, o despus de tener un beb.  Estas lesiones pueden ser dolorosas. La mayora de las lesiones en el coxis mejoran por s solas en el trmino de 4 a 6semanas.  Sintese sobre un aro de goma o inflable o un cojn grande para Best boy.  Evite permanecer sentado en la misma posicin durante [employer].  Siga las sugerencias del mdico en lo que respecta a prevenir o tratar los problemas para Landscape architect. Esta informacin no tiene Marine scientist el consejo del mdico. Asegrese de hacerle al mdico cualquier pregunta que tenga. Document Released: 06/28/2010 Document Revised: 05/23/2017 Document Reviewed: 05/23/2017 Elsevier Patient Education  2020 [employer].

## 2019-02-11 ENCOUNTER — Telehealth: Payer: Self-pay

## 2019-02-11 DIAGNOSIS — O24419 Gestational diabetes mellitus in pregnancy, unspecified control: Secondary | ICD-10-CM | POA: Insufficient documentation

## 2019-02-11 DIAGNOSIS — O0940 Supervision of pregnancy with grand multiparity, unspecified trimester: Secondary | ICD-10-CM | POA: Insufficient documentation

## 2019-02-11 LAB — GLUCOSE, 1 HOUR GESTATIONAL: Gestational Diabetes Screen: 147 mg/dL — ABNORMAL HIGH (ref 65–139)

## 2019-02-11 LAB — RPR: RPR Ser Ql: NONREACTIVE

## 2019-02-11 NOTE — Telephone Encounter (Signed)
Call to client-via interpreter V. Olmedo-discussed abnl 1 Hr Gtt and scheduled 3 hr Gtt appt. 02/12/19 -to arrive @ 8:30; instructions given Debera Lat, RN

## 2019-02-12 ENCOUNTER — Other Ambulatory Visit: Payer: Self-pay

## 2019-02-12 DIAGNOSIS — O99810 Abnormal glucose complicating pregnancy: Secondary | ICD-10-CM

## 2019-02-12 NOTE — Progress Notes (Signed)
Presents to [facility] for 3 hour GTT. Last food intake yesterday at 1630 and last liquid intake at 1930 last night. Client aware may have sips of plain water this am during testing, to remain at [facility] during testing and to return to Nurse [facility] for problems or questions. Client has printed copy of times to return to lab for testing. Aware of 02/24/2019 appt with 0840 arrival time. Rich Number, RN

## 2019-02-13 ENCOUNTER — Encounter: Payer: Self-pay | Admitting: Physician Assistant

## 2019-02-13 ENCOUNTER — Telehealth: Payer: Self-pay | Admitting: Physician Assistant

## 2019-02-13 LAB — GLUCOSE TOLERANCE TEST, 6 HOUR
Glucose, 1 Hour GTT: 190 mg/dL (ref 65–199)
Glucose, 2 hour: 213 mg/dL — ABNORMAL HIGH (ref 65–139)
Glucose, 3 hour: 163 mg/dL — ABNORMAL HIGH (ref 65–109)
Glucose, GTT - Fasting: 95 mg/dL (ref 65–99)

## 2019-02-13 NOTE — Telephone Encounter (Signed)
Counseled pt re: new dx GDM. Rec referral for GDM education, and home BS testing. Counseled importance of BS control to reduce chance of pregnancy/labor/delivery complications. Some pts can control BS with diet/exercise, some need medication. Pt agrees to [facility] consult for diabetic counseling and initiation of home BS monitoring.

## 2019-02-18 NOTE — Telephone Encounter (Signed)
[facility] Referral Form / A. Streilein PA-C handwritten referral form, Snapshot and glucola lab results faxed with confirmation received. Rich Number, RN

## 2019-02-24 ENCOUNTER — Other Ambulatory Visit: Payer: Self-pay

## 2019-02-24 ENCOUNTER — Ambulatory Visit: Payer: Self-pay | Admitting: Advanced Practice Midwife

## 2019-02-24 VITALS — BP 95/57 | Temp 98.3°F | Wt 171.6 lb

## 2019-02-24 DIAGNOSIS — O99013 Anemia complicating pregnancy, third trimester: Secondary | ICD-10-CM

## 2019-02-24 DIAGNOSIS — O0940 Supervision of pregnancy with grand multiparity, unspecified trimester: Secondary | ICD-10-CM

## 2019-02-24 DIAGNOSIS — O0993 Supervision of high risk pregnancy, unspecified, third trimester: Secondary | ICD-10-CM | POA: Insufficient documentation

## 2019-02-24 DIAGNOSIS — O24419 Gestational diabetes mellitus in pregnancy, unspecified control: Secondary | ICD-10-CM

## 2019-02-24 LAB — URINALYSIS
Bilirubin, UA: NEGATIVE
Glucose, UA: NEGATIVE
Leukocytes,UA: NEGATIVE
Nitrite, UA: POSITIVE — AB
Protein,UA: NEGATIVE
RBC, UA: NEGATIVE
Specific Gravity, UA: 1.025 (ref 1.005–1.030)
Urobilinogen, Ur: 0.2 mg/dL (ref 0.2–1.0)
pH, UA: 5.5 (ref 5.0–7.5)

## 2019-02-24 NOTE — Progress Notes (Signed)
Patient given dental list. Patient counseled on need for more protein snacks due to ketones in her urine. Per patient last time she ate was yesterday around 5:30pm. Patient states she did not eat before she came to clinic today. Patient counseled to eat a high protein snack or breakfast before coming to clinic and to eat small protein rich snacks throughout the day. Per [facility], they have received referral for diabetes clinic and will review and call patient. Patient phone number verified, and patient told to expect a call from [facility], hopefully this week. Patient states she has made changes to her diet and know what to eat. Patient counseled that nutritionist will help her make some more changes and she will be taught how to check her blood sugars.Jenetta Downer, RN

## 2019-02-24 NOTE — Progress Notes (Signed)
Patient here for MH RV at 30 3/7. Needs urine dip today. States she has not heard from [facility] about GDM consult (does not have blood sugar log yet).Jenetta Downer, RN

## 2019-02-24 NOTE — Addendum Note (Signed)
Addended by: Jenetta Downer on: 02/24/2019 10:21 AM   Modules accepted: Orders

## 2019-02-24 NOTE — Progress Notes (Signed)
   PRENATAL VISIT NOTE  Subjective:  Leah Banks is a 43 y.o. [phone] at [redacted]w[redacted]d being seen today for ongoing prenatal care.  She is currently monitored for the following issues for this high-risk pregnancy and has Anemia in pregnancy; Supervision of other normal pregnancy, antepartum; Type B blood, Rh positive; Advanced maternal age in multigravida, unspecified trimester 43 yo;  placenta previa on 12/11/18 u/s at 5 5/7--covers internal os by 3 cm; Abnormal chromosomal and genetic finding on antenatal screening mother; Gestational diabetes mellitus (GDM) affecting seventh pregnancy; and Supervision of high risk pregnancy in third trimester on their problem list.  Patient reports no complaints.  Contractions: Not present. Vag. Bleeding: None.  Movement: Present. Denies leaking of fluid/ROM.   The following portions of the patient's history were reviewed and updated as appropriate: allergies, current medications, past family history, past medical history, past social history, past surgical history and problem list. Problem list updated.  Objective:   Vitals:   02/24/19 0900  BP: (!) 95/57  Temp: 98.3 F (36.8 C)  Weight: 171 lb 9.6 oz (77.8 kg)    Fetal Status: Fetal Heart Rate (bpm): 130 Fundal Height: 32 cm Movement: Present     General:  Alert, oriented and cooperative. Patient is in no acute distress.  Skin: Skin is warm and dry. No rash noted.   Cardiovascular: Normal heart rate noted  Respiratory: Normal respiratory effort, no problems with respiration noted  Abdomen: Soft, gravid, appropriate for gestational age.  Pain/Pressure: Absent     Pelvic: Cervical exam deferred        Extremities: Normal range of motion.  Edema: None  Mental Status: Normal mood and affect. Normal behavior. Normal judgment and thought content.   Assessment and Plan:  Pregnancy: [id number] at [redacted]w[redacted]d  1. Gestational diabetes mellitus (GDM) affecting seventh pregnancy [facility] diabetic referral faxed by  [place] on 02/18/19 with still no response.  RN called and [facility] has referral and will call pt this week.  Thus pt has not been educated about diabetes, has not received glucometer and has not been checking blood sugars.  Urine today--3+ ketones--last ate last night 1730   2. Supervision of high risk pregnancy in third trimester Pt reminded of f/u u/s for placenta previa on 03/05/19  3. Anemia during pregnancy in third trimester Taking FeSo4 I daily with juice seperately from vits   Preterm labor symptoms and general obstetric precautions including but not limited to vaginal bleeding, contractions, leaking of fluid and fetal movement were reviewed in detail with the patient. Please refer to After Visit Summary for other counseling recommendations.  Return in about 1 week (around 03/03/2019) for routine PNC.  No future appointments.  Herbie Saxon, CNM

## 2019-02-28 ENCOUNTER — Observation Stay: Payer: Medicaid Other

## 2019-02-28 ENCOUNTER — Encounter: Payer: Self-pay | Admitting: *Deleted

## 2019-02-28 ENCOUNTER — Other Ambulatory Visit: Payer: Self-pay

## 2019-02-28 ENCOUNTER — Observation Stay
Admission: EM | Admit: 2019-02-28 | Discharge: 2019-02-28 | Disposition: A | Discharge status: Home / Self Care | Payer: Medicaid Other | Attending: Obstetrics & Gynecology | Admitting: Obstetrics & Gynecology

## 2019-02-28 DIAGNOSIS — O24419 Gestational diabetes mellitus in pregnancy, unspecified control: Secondary | ICD-10-CM

## 2019-02-28 DIAGNOSIS — O4693 Antepartum hemorrhage, unspecified, third trimester: Principal | ICD-10-CM | POA: Insufficient documentation

## 2019-02-28 DIAGNOSIS — O4403 Placenta previa specified as without hemorrhage, third trimester: Secondary | ICD-10-CM

## 2019-02-28 DIAGNOSIS — Z3A31 31 weeks gestation of pregnancy: Secondary | ICD-10-CM | POA: Insufficient documentation

## 2019-02-28 DIAGNOSIS — O26873 Cervical shortening, third trimester: Secondary | ICD-10-CM

## 2019-02-28 DIAGNOSIS — O26893 Other specified pregnancy related conditions, third trimester: Secondary | ICD-10-CM | POA: Diagnosis present

## 2019-02-28 HISTORY — DX: Supervision of pregnancy with grand multiparity, unspecified trimester: O09.40

## 2019-02-28 LAB — COMPREHENSIVE METABOLIC PANEL
ALT: 15 U/L (ref 0–44)
AST: 16 U/L (ref 15–41)
Albumin: 3 g/dL — ABNORMAL LOW (ref 3.5–5.0)
Alkaline Phosphatase: 70 U/L (ref 38–126)
Anion gap: 9 (ref 5–15)
BUN: 12 mg/dL (ref 6–20)
CO2: 22 mmol/L (ref 22–32)
Calcium: 8.6 mg/dL — ABNORMAL LOW (ref 8.9–10.3)
Chloride: 107 mmol/L (ref 98–111)
Creatinine, Ser: 0.59 mg/dL (ref 0.44–1.00)
GFR calc Af Amer: >60 mL/min (ref >60)
GFR calc non Af Amer: >60 mL/min (ref >60)
Glucose, Bld: 88 mg/dL (ref 70–99)
Potassium: 3.7 mmol/L (ref 3.5–5.1)
Sodium: 138 mmol/L (ref 135–145)
Total Bilirubin: 0.4 mg/dL (ref 0.3–1.2)
Total Protein: 6.1 g/dL — ABNORMAL LOW (ref 6.5–8.1)

## 2019-02-28 LAB — CBC
HCT: 30.5 % — ABNORMAL LOW (ref 36.0–46.0)
Hemoglobin: 10.4 g/dL — ABNORMAL LOW (ref 12.0–15.0)
MCH: 30.1 pg (ref 26.0–34.0)
MCHC: 34.1 g/dL (ref 30.0–36.0)
MCV: 88.2 fL (ref 80.0–100.0)
Platelets: 173 10*3/uL (ref 150–400)
RBC: 3.46 MIL/uL — ABNORMAL LOW (ref 3.87–5.11)
RDW: 14.5 % (ref 11.5–15.5)
WBC: 7.3 10*3/uL (ref 4.0–10.5)
nRBC: 0 % (ref 0.0–0.2)

## 2019-02-28 LAB — KLEIHAUER-BETKE STAIN
Fetal Cells %: 0.9 %
Quantitation Fetal Hemoglobin: 0.0044 mL

## 2019-02-28 LAB — TYPE AND SCREEN
ABO/RH(D): B POS
Antibody Screen: NEGATIVE

## 2019-02-28 MED ORDER — BETAMETHASONE SOD PHOS & ACET 6 (3-3) MG/ML IJ SUSP: 12.0000 mg | Freq: Once | INTRAMUSCULAR | 0 refills | Status: AC

## 2019-02-28 MED ORDER — BETAMETHASONE SOD PHOS & ACET 6 (3-3) MG/ML IJ SUSP
12.0000 mg | INTRAMUSCULAR | Status: DC
  Administered 2019-02-28: 14:00:00 12 mg via INTRAMUSCULAR

## 2019-02-28 MED ORDER — BETAMETHASONE SOD PHOS & ACET 6 (3-3) MG/ML IJ SUSP
INTRAMUSCULAR | Status: AC
  Administered 2019-02-28: 12 mg via INTRAMUSCULAR
  Filled 2019-02-28: qty 5

## 2019-02-28 MED ORDER — TERBUTALINE SULFATE 1 MG/ML IJ SOLN
INTRAMUSCULAR | Status: AC
  Administered 2019-02-28: 14:00:00 0.25 mg via SUBCUTANEOUS
  Filled 2019-02-28: qty 1

## 2019-02-28 MED ORDER — TERBUTALINE SULFATE 1 MG/ML IJ SOLN
0.2500 mg | Freq: Once | INTRAMUSCULAR | Status: AC | PRN
  Administered 2019-02-28: 14:00:00 0.25 mg via SUBCUTANEOUS

## 2019-02-28 NOTE — Progress Notes (Signed)
Pt received first dose of Betamethasone 12.5mg  12/4 @1354 

## 2019-02-28 NOTE — Discharge Summary (Addendum)
Leah Banks is a 43 y.o. female. She is at [redacted]w[redacted]d gestation. Patient's last menstrual period was 07/26/2018 (exact date). Estimated Date of Delivery: 05/02/19  Prenatal care site: [facility] (unassigned)  Chief complaint: vaginal bleeding  Location: vagina Onset/timing: this morning Duration: 2 hours  Quality: bright red blood mixed with brown blood Severity: mild Aggravating or alleviating conditions: nothing made better or worse Associated signs/symptoms: no CTX, no LOF,  Active fetal movement.   Context: Patient has known placenta previa, being followed by MFM @ [facility].  Last [place] was 3 wks ago, scheduled for one next week.  This morning had bleeding per vagina and came here for evaluation.  No commotion in vagina in last several weeks.  No pain.   S: Resting comfortably.  Maternal Medical History:   Past Medical History:  Diagnosis Date  . Anemia in pregnancy 10/30/2018  . Gestational diabetes mellitus (GDM) affecting seventh pregnancy     History reviewed. No pertinent surgical history.  No Known Allergies  Prior to Admission medications   Medication Sig Start Date End Date Taking? Authorizing Provider  betamethasone acetate-betamethasone sodium phosphate (CELESTONE) 6 (3-3) MG/ML injection Inject 2 mLs (12 mg total) into the muscle once for 1 dose. 03/01/19 03/01/19  Ward, Elenora Fender, MD  ferrous sulfate 324 (65 Fe) MG TBEC Take 1 tablet (325 mg total) by mouth daily. 01/20/19 04/22/19  Sciora, Austin Miles, CNM  Prenatal Vit-Fe Fumarate-FA (MULTIVITAMIN-PRENATAL) 27-0.8 MG TABS tablet Take 1 tablet by mouth daily at 12 noon.    [provider]     Social History: She  reports that she has never smoked. She has never used smokeless tobacco. She reports that she does not drink alcohol or use drugs.  Family History: family history includes Diabetes in her father and mother.    Review of Systems: A full review of systems was performed and negative except as noted in the  HPI.     O:  BP 108/60 (BP Location: Left Arm)   Pulse 93   Temp 98.9 F (37.2 C) (Oral)   Resp 18   LMP 07/26/2018 (Exact Date)  Results for orders placed or performed during the hospital encounter of 02/28/19 (from the past 48 hour(s))  Comprehensive metabolic panel   Collection Time: 02/28/19  1:44 PM  Result Value Ref Range   Sodium 138 135 - 145 mmol/L   Potassium 3.7 3.5 - 5.1 mmol/L   Chloride 107 98 - 111 mmol/L   CO2 22 22 - 32 mmol/L   Glucose, Bld 88 70 - 99 mg/dL   BUN 12 6 - 20 mg/dL   Creatinine, Ser 6.21 0.44 - 1.00 mg/dL   Calcium 8.6 (L) 8.9 - 10.3 mg/dL   Total Protein 6.1 (L) 6.5 - 8.1 g/dL   Albumin 3.0 (L) 3.5 - 5.0 g/dL   AST 16 15 - 41 U/L   ALT 15 0 - 44 U/L   Alkaline Phosphatase 70 38 - 126 U/L   Total Bilirubin 0.4 0.3 - 1.2 mg/dL   GFR calc non Af Amer >60 >60 mL/min   GFR calc Af Amer >60 >60 mL/min   Anion gap 9 5 - 15  CBC on admission   Collection Time: 02/28/19  1:44 PM  Result Value Ref Range   WBC 7.3 4.0 - 10.5 K/uL   RBC 3.46 (L) 3.87 - 5.11 MIL/uL   Hemoglobin 10.4 (L) 12.0 - 15.0 g/dL   HCT 30.8 (L) 65.7 - 84.6 %  MCV 88.2 80.0 - 100.0 fL   MCH 30.1 26.0 - 34.0 pg   MCHC 34.1 30.0 - 36.0 g/dL   RDW 14.5 11.5 - 15.5 %   Platelets 173 150 - 400 K/uL   nRBC 0.0 0.0 - 0.2 %  Type and screen [place]   Collection Time: 02/28/19  1:44 PM  Result Value Ref Range   ABO/RH(D) B POS    Antibody Screen NEG    Sample Expiration      03/03/2019,2359 Performed at [facility], [address], [place], [place] [address]      Constitutional: NAD, AAOx3  HE/ENT: extraocular movements grossly intact, moist mucous membranes CV: RRR PULM: nl respiratory effort, CTABL     Abd: gravid, non-tender, non-distended, soft      Ext: Non-tender, Nonedmeatous   Psych: mood appropriate, speech normal Pelvic deferred, no exam due to previa.  No bleeding upon presentation  NST:  Baseline: 140 Variability:  moderate Accelerations present x >2 Decelerations absent Time 74mins  US Ob Comp + 14 Wk  Result Date: 02/28/2019 CLINICAL DATA:  43 year old pregnant female with vaginal bleeding, presents for assessment of fetal growth, AFI and placental location. Prior obstetric scans reportedly performed at outside facility. EXAM: OBSTETRICAL ULTRASOUND >14 WKS AND TRANSVAGINAL OB US FINDINGS: Number of Fetuses: 1 Heart Rate: 135 bpm Movement: Yes Presentation: Cephalic Previa: Marginal posterior placenta previa, with inferior placental tip located 0.9 cm from the internal cervical os on transvaginal views. Placental Location: Posterior right Amniotic Fluid (Subjective): Normal Amniotic Fluid (Objective): Vertical pocket 6.9cm AFI 16.2 cm (5%ile= 9.0 cm, 95%= 23.4 cm for 31 wks) FETAL BIOMETRY BPD:  7.6cm 30w 5d HC:    28.7cm 31w 4d AC:    29.9cm 33w 6d FL:    5.9cm 30w 6d Current Mean GA: 31w 5d [place] EDC: 04/27/2019 Assigned GA: 31w 0d Assigned EDC: 05/02/2019 Estimated Fetal Weight:  1,982g 85%ile FETAL ANATOMY Lateral Ventricles: Appears normal Thalami/CSP: Appears normal Posterior Fossa: Appears normal Nuchal Region: Appears normal    NFT= N/A > 20 WKS Upper Lip: Not well visualized Spine: Appears normal 4 Chamber Heart on Left: Appears normal LVOT: Appears normal RVOT: Not well visualized Stomach on Left: Appears normal 3 Vessel Cord: Appears normal Cord Insertion site: Appears normal on limited views Kidneys: Appears normal Bladder: Appears normal Extremities: Appears normal, 4 extremities demonstrated Sex: Female external genitalia Technical Limitations: Advanced gestational age Maternal Findings: Cervix: Cervix length 4.9 cm on transvaginal views with no evidence of internal cervical funneling. IMPRESSION: 1. Single living intrauterine gestation in cephalic lie at 31 weeks 5 days by average ultrasound age, concordant with provided dating. 2. Estimated fetal weight 1982 g, at the 85th percentile for expected  gestational age. 3. Normal amniotic fluid volume.  AFI 16.2. 4. Marginal posterior placenta previa. 5. Otherwise no fetal or maternal abnormalities detected, with limitations as detailed. Electronically Signed   By: Ilona Sorrel M.D.   On: 02/28/2019 15:53     Assessment: 43 y.o. [redacted]w[redacted]d here for antenatal surveillance during pregnancy.  Principle diagnosis: bleeding with placenta previa  Plan:  Labor: contractions likely secondary to blood.  Terbutaline given x1 with appropriate response. Rare contractions at discharge. No bleeding since presentation.  Fetal Wellbeing: Reassuring Cat 1 tracing.  Reactive NST  Previa:  Now marginal with 0.9cm from internal os.  Repeat imaging in 4 weeks.  Does not have to keep appointment on Thursday unless primary OB prefers another ultrasound.   Anemia -  on iron  Return tomorrow for second dose of betamethasone  D/c home stable, precautions reviewed, follow-up as scheduled.   ----- Ranae Plumberhelsea Ward, MD Attending Obstetrician and Gynecologist [facility], Department of OB/GYN [facility]

## 2019-02-28 NOTE — Progress Notes (Signed)
Pt off the floor for abdominal Ultrasound.

## 2019-02-28 NOTE — Progress Notes (Signed)
Pt returned to [facility] from Ultrasound. RN aware. Monitors applied.

## 2019-02-28 NOTE — Progress Notes (Signed)
Pt received discharge order per C.Ward, MD. Pt received AVS and discharge instructions with interpreter at bedside. Pt educated on importance of coming back tomorrow at 1400 for a 2nd dose of Betamethasone. Pt verbalized understanding and will return to [facility] L&D tomorrow at 1400. Pt received information on vaginal bleeding, preterm labor, pt given labor precautions. Pt verbalized understanding. Pt has a follow up appointment on Monday 03/03/2019. Pt understands she needs to keep that appointment with her OB. Pt d/c home with spouse via personal vehicle.

## 2019-02-28 NOTE — Discharge Instructions (Signed)
Keep appointment on Monday

## 2019-02-28 NOTE — OB Triage Note (Signed)
Patient arrived via EMS for bleeding that started at 0930. Patient arrived with small amount of blood noted on pad that was red and brown. Pt states baby is moving well. Denies LOF, pain or any other concerns. Denies intercourse or any ting in vagina in past 24 hours.

## 2019-03-01 ENCOUNTER — Inpatient Hospital Stay
Admission: RE | Admit: 2019-03-01 | Discharge: 2019-03-01 | Disposition: A | Discharge status: Home / Self Care | Payer: Self-pay | Attending: Obstetrics and Gynecology | Admitting: Obstetrics and Gynecology

## 2019-03-01 DIAGNOSIS — O4703 False labor before 37 completed weeks of gestation, third trimester: Secondary | ICD-10-CM | POA: Insufficient documentation

## 2019-03-01 DIAGNOSIS — O4693 Antepartum hemorrhage, unspecified, third trimester: Secondary | ICD-10-CM | POA: Insufficient documentation

## 2019-03-01 MED ORDER — BETAMETHASONE SOD PHOS & ACET 6 (3-3) MG/ML IJ SUSP
INTRAMUSCULAR | Status: AC
  Administered 2019-03-01: 15:00:00 12 mg via INTRAMUSCULAR
  Filled 2019-03-01: qty 5

## 2019-03-01 MED ORDER — BETAMETHASONE SOD PHOS & ACET 6 (3-3) MG/ML IJ SUSP
12.0000 mg | Freq: Once | INTRAMUSCULAR | Status: AC
  Administered 2019-03-01: 15:00:00 12 mg via INTRAMUSCULAR

## 2019-03-03 ENCOUNTER — Ambulatory Visit: Payer: Self-pay | Admitting: Advanced Practice Midwife

## 2019-03-03 ENCOUNTER — Other Ambulatory Visit: Payer: Self-pay

## 2019-03-03 ENCOUNTER — Telehealth: Payer: Self-pay

## 2019-03-03 VITALS — BP 105/55 | Temp 98.0°F | Wt 176.6 lb

## 2019-03-03 DIAGNOSIS — O09529 Supervision of elderly multigravida, unspecified trimester: Secondary | ICD-10-CM

## 2019-03-03 DIAGNOSIS — O0940 Supervision of pregnancy with grand multiparity, unspecified trimester: Secondary | ICD-10-CM

## 2019-03-03 DIAGNOSIS — O24419 Gestational diabetes mellitus in pregnancy, unspecified control: Secondary | ICD-10-CM

## 2019-03-03 DIAGNOSIS — O4403 Placenta previa specified as without hemorrhage, third trimester: Secondary | ICD-10-CM

## 2019-03-03 DIAGNOSIS — O99013 Anemia complicating pregnancy, third trimester: Secondary | ICD-10-CM

## 2019-03-03 DIAGNOSIS — O0993 Supervision of high risk pregnancy, unspecified, third trimester: Secondary | ICD-10-CM

## 2019-03-03 LAB — URINALYSIS
Bilirubin, UA: NEGATIVE
Glucose, UA: NEGATIVE
Ketones, UA: NEGATIVE
Leukocytes,UA: NEGATIVE
Nitrite, UA: POSITIVE — AB
Protein,UA: NEGATIVE
Specific Gravity, UA: 1.02 (ref 1.005–1.030)
Urobilinogen, Ur: 0.2 mg/dL (ref 0.2–1.0)
pH, UA: 6.5 (ref 5.0–7.5)

## 2019-03-03 NOTE — Addendum Note (Signed)
Addended by: Doy Mince on: 03/03/2019 11:26 AM   Modules accepted: Orders

## 2019-03-03 NOTE — Progress Notes (Signed)
Provider orders completed. Pt awaiting follow-up phone call about U/S details. Urine Culture and Sensitivity added per verbal order by Donnal Moat, CNM upon review of urine dip.

## 2019-03-03 NOTE — Progress Notes (Signed)
In for visit; has not started checking BS yet-aware of 03/06/19 [facility] appts.; was @ [facility] 02/28/19 with bleeding-u/s done-will clarify if needs to go for 03/06/19 [facility] u/s or only OB; taking PNV Debera Lat, RN

## 2019-03-03 NOTE — Progress Notes (Addendum)
   PRENATAL VISIT NOTE  Subjective:  Leah Banks is a 43 y.o. [phone] at [redacted]w[redacted]d being seen today for ongoing prenatal care.  She is currently monitored for the following issues for this high-risk pregnancy and has Anemia in pregnancy; Supervision of other normal pregnancy, antepartum; Type B blood, Rh positive; Advanced maternal age in multigravida, unspecified trimester 43 yo;  placenta previa on 12/11/18 u/s at 79 5/7--covers internal os by 3 cm; Abnormal chromosomal and genetic finding on antenatal screening mother; Gestational diabetes mellitus (GDM) affecting seventh pregnancy; Supervision of high risk pregnancy in third trimester; and Vaginal bleeding in pregnancy, third trimester--[facility] 02/28/19 and 03/01/19 on their problem list.  Patient reports no complaints.  Contractions: Not present. Vag. Bleeding: None.  Movement: Present. Denies leaking of fluid/ROM.   The following portions of the patient's history were reviewed and updated as appropriate: allergies, current medications, past family history, past medical history, past social history, past surgical history and problem list. Problem list updated.  Objective:   Vitals:   03/03/19 0956  BP: (!) 105/55  Temp: 98 F (36.7 C)  Weight: 176 lb 9.6 oz (80.1 kg)    Fetal Status: Fetal Heart Rate (bpm): 150 Fundal Height: 37 cm Movement: Present     General:  Alert, oriented and cooperative. Patient is in no acute distress.  Skin: Skin is warm and dry. No rash noted.   Cardiovascular: Normal heart rate noted  Respiratory: Normal respiratory effort, no problems with respiration noted  Abdomen: Soft, gravid, appropriate for gestational age.  Pain/Pressure: Absent     Pelvic: Cervical exam deferred        Extremities: Normal range of motion.  Edema: None  Mental Status: Normal mood and affect. Normal behavior. Normal judgment and thought content.   Assessment and Plan:  Pregnancy: [id number] at [redacted]w[redacted]d  1. Gestational diabetes  mellitus (GDM) affecting seventh pregnancy Has [facility] u/s and OB appt 03/06/19 and 03/11/19 diabetic nutrition telehealth call.  Pt plans to attend appts - Urinalysis (Urine Dip)=+nitrites, neg glucose, 1+ blood C&S ordered  2. Supervision of high risk pregnancy in third trimester Not working.  Went to [facility] 02/28/19 with bleeding, u/c's,  and complete placenta previa.  Received Terbutaline SQ x1 and Betamethasone x2 (02/28/19 and 03/01/19).  Reactive NST and spontaneous cessation of bleeding.  U/s  02/28/19 with AFI=wnl, EFW=85%, normal anatomy, posterior placenta 0.9 cm from internal cervical os at 31 5/7.   3. Anemia during pregnancy in third trimester Taking FeSo4 I daily seperately from vits  4. Placenta previa in third trimester Went to [facility] with bleeding on 02/28/19.  See above note.  U/s reveals no longer complete previa but 0.9 cm from internal cervical os with marginal posterior previa.  To f/u with [facility] 03/06/19.  5. Advanced maternal age in multigravida, unspecified trimester 43 yo    Preterm labor symptoms and general obstetric precautions including but not limited to vaginal bleeding, contractions, leaking of fluid and fetal movement were reviewed in detail with the patient. Please refer to After Visit Summary for other counseling recommendations.  Return in about 1 week (around 03/10/2019) for routine PNC.  No future appointments.  Herbie Saxon, CNM

## 2019-03-03 NOTE — Telephone Encounter (Signed)
Call to client via interpreter V. Olmedo; informed per [facility] that will not need the u/s 03/06/19 since had one @ [facility] 02/28/19 but will need to go for OB part of visit and check in @ 2:15-agrees to plan Debera Lat, RN

## 2019-03-04 ENCOUNTER — Telehealth: Payer: Self-pay | Admitting: Family Medicine

## 2019-03-04 NOTE — Telephone Encounter (Signed)
Returned client call via interpreter #[phone]-reports when goes to bathroom seeing drops of blood and having low abd. Pain; reports good FM; consulted E. Sciora, CNM and advised to go to hospital now for eval-agrees to plan Debera Lat, RN

## 2019-03-04 NOTE — Telephone Encounter (Signed)
patient is having pain and bleeding and was told to call if she experienced any

## 2019-03-05 LAB — URINE CULTURE

## 2019-03-05 MED ORDER — QUINERVA 260 MG PO TABS
650.00 | ORAL_TABLET | ORAL | Status: DC
Start: ? — End: 2019-03-05

## 2019-03-05 MED ORDER — ISOVUE-M 300 61 % IJ SOLN
4.00 | INTRAMUSCULAR | Status: DC
Start: ? — End: 2019-03-05

## 2019-03-05 MED ORDER — RUBIDIUM CHLORIDE POWD
1.00 | Status: DC
Start: 2019-03-05 — End: 2019-03-05

## 2019-03-05 MED ORDER — OLANZAPINE-FLUOXETINE HCL 6-50 MG PO CAPS
3.00 | ORAL_CAPSULE | ORAL | Status: DC
Start: ? — End: 2019-03-05

## 2019-03-05 MED ORDER — VICON FORTE PO CAPS
17.00 | ORAL_CAPSULE | ORAL | Status: DC
Start: ? — End: 2019-03-05

## 2019-03-10 ENCOUNTER — Ambulatory Visit: Payer: Self-pay

## 2019-05-16 ENCOUNTER — Other Ambulatory Visit: Payer: Self-pay

## 2019-05-16 ENCOUNTER — Ambulatory Visit: Payer: Self-pay | Admitting: Family Medicine

## 2019-05-16 LAB — HEMOGLOBIN, FINGERSTICK: Hemoglobin: 13 g/dL (ref 11.1–15.9)

## 2019-05-16 NOTE — Progress Notes (Signed)
Hgb reviewed, no tx per standing orders. 2 hour post prandial lab scheduled for 05/22/2019. Provider orders completed.

## 2019-05-16 NOTE — Progress Notes (Signed)
Post Partum Exam  Leah Banks is a 44 y.o. [phone] female who presents for a postpartum visit. She is 7 weeks postpartum following a low cervical transverse Cesarean section. I have fully reviewed the prenatal and intrapartum course. The delivery was at [redacted]w[redacted]d gestational weeks d/t bleeding in the setting of placenta previa.  Anesthesia: spinal. Postpartum course has been going well. Baby's course has been going well. Baby is feeding by Bottle and Breast Bleeding no bleeding. Bowel function is normal. Bladder function is normal. Patient is not sexually active. Contraception method is IUD placed at hospital.  GDM: she has not taken medication since leaving hospital. Has not check BS at home. States she is feeling well.   Postpartum depression screening: score 2. Emotionally feeling well, just tired.  C/s scar burns, hurts w/ certain movements or pressure.   The following portions of the patient's history were reviewed and updated as appropriate: allergies, current medications, past family history, past medical history, past social history, past surgical history and problem list.  Last pap smear done last week at [facility] and was Normal per pt.   Review of Systems A comprehensive review of systems was negative.    Objective:  BP 97/63   Ht 5\' 2"  (1.575 m)   Wt 164 lb 12.8 oz (74.8 kg)   LMP 05/06/2019 (Exact Date) Comment: normal  Breastfeeding Yes   BMI 30.14 kg/m   Gen: well appearing, NAD, cheerful HEENT: no scleral icterus CV: RR Lung: Normal WOB Breast:not performed-not indicated  Abdomen: c/s scar closed, no erythema, no masses or fluctuation, +minimal tenderness along scar at left end. Otherwise no abdominal tenderness in all 4 quadrants. Ext: warm well perfused  GU: not performed Rectal: not performed -  not indicated       Assessment:    Normal postpartum exam. Pap smear not done at today's visit.   Plan:   1. Contraception: Contraception counseling:  Reviewed all forms of birth control options in the tiered based approach. available including abstinence; over the counter/barrier methods; hormonal contraceptive medication including pill, patch, ring, injection,contraceptive implant; hormonal and nonhormonal IUDs; permanent sterilization options including vasectomy and the various tubal sterilization modalities. Risks, benefits, and typical effectiveness rates were reviewed.  Questions were answered.  Written information was also given to the patient to review.  Patient desires IUD, had this placed in the hospital prior to discharge. She will follow up in  1 year for surveillance.  She was told to call with any further questions, or with any concerns about this method of contraception.  Emphasized use of condoms 100% of the time for STI prevention.  ECP n/a.  Recommended delay in pregnancy for 18 months   2. Infant feeding:  patient is currently feeding with breast and formula.  If breastmilk feeding patient was given letter for employer to provide appropriate pumping time to express breastmilk - n/a  -Recommended patient engage with WIC/BFpeer counselors  -Counseled to sign new child up for [facility] services 3. Mood: EPDS is low risk. Reviewed resources and that mood sx in first year after pregnancy are considered related to pregnancy and to reach out for help at [facility] if needed. Discussed [facility] as link to care and availability of LCSW for counseling  4. Chronic Medical Conditions:  Hx of GDM - will come back for RN visit for 2 hr GTT.   1. Postpartum exam -C/s scar with slight tenderness, no signs of infection on exam. She states pain in improving from  prior. Discussed typical healing time following c/s, advised to f/u with [facility] if pain persists or worsens or fever develops. She is in agreement with plan. -Advised pt to RTC w/in the next week for a 2 hr GTT RN visit.  - Hemoglobin, venipuncture - WNL today   Patient given handout about PCP care in the  community Given MVI per family planning program guidelines and availability  Follow up in: 2 days for 2 hr GTT or as needed.

## 2019-05-16 NOTE — Progress Notes (Signed)
Language Line used. Gave birth 03/25/2019 by C-section. Pt states no changes to her medical hx or family medical hx since her last visit at [facility].

## 2019-05-17 ENCOUNTER — Encounter: Payer: Self-pay | Admitting: Family Medicine

## 2019-05-22 ENCOUNTER — Other Ambulatory Visit: Payer: Self-pay

## 2019-05-22 ENCOUNTER — Other Ambulatory Visit (LOCAL_COMMUNITY_HEALTH_CENTER): Payer: Self-pay

## 2019-05-22 DIAGNOSIS — O24419 Gestational diabetes mellitus in pregnancy, unspecified control: Secondary | ICD-10-CM

## 2019-05-22 NOTE — Progress Notes (Signed)
Pt to clinic for 2 hour post prandial; this lab originally ordered at post partum visit on 05/16/2019 by Tacey Heap, PA (based on routine labs normally ordered for persons with diabetes during pregancy), and pt given instructions before leaving PP visit. Labs were to be changed to 2 hour glucose if able to instruct pt prior to her appt; pt never reached prior to appt. Pt arrived on 05/22/19 for post prandial. Pt stated she started eating around 8:15 this morning and finished around 8:20. Pt sent to lab for bloodwork.

## 2019-05-23 LAB — GLUCOSE, TWO-HOUR POSTPRANDIAL: Glucose, Two-Hour Postprandial: 95 mg/dL (ref 65–139)

## 2019-07-18 NOTE — Telephone Encounter (Signed)
Read and agree with consult 

## 2020-01-02 ENCOUNTER — Telehealth: Payer: Self-pay | Admitting: *Deleted

## 2020-01-06 ENCOUNTER — Ambulatory Visit: Payer: Self-pay

## 2020-12-01 IMAGING — DX PORTABLE CHEST - 1 VIEW
1 series · 1 of 1 positions shown · non-contrast
Comparison: None.

CLINICAL DATA: 43-year-old female is 11 weeks pregnant with
shortness of breath, cough, loss of smell.

EXAM:
PORTABLE CHEST 1 VIEW

[chest ap]
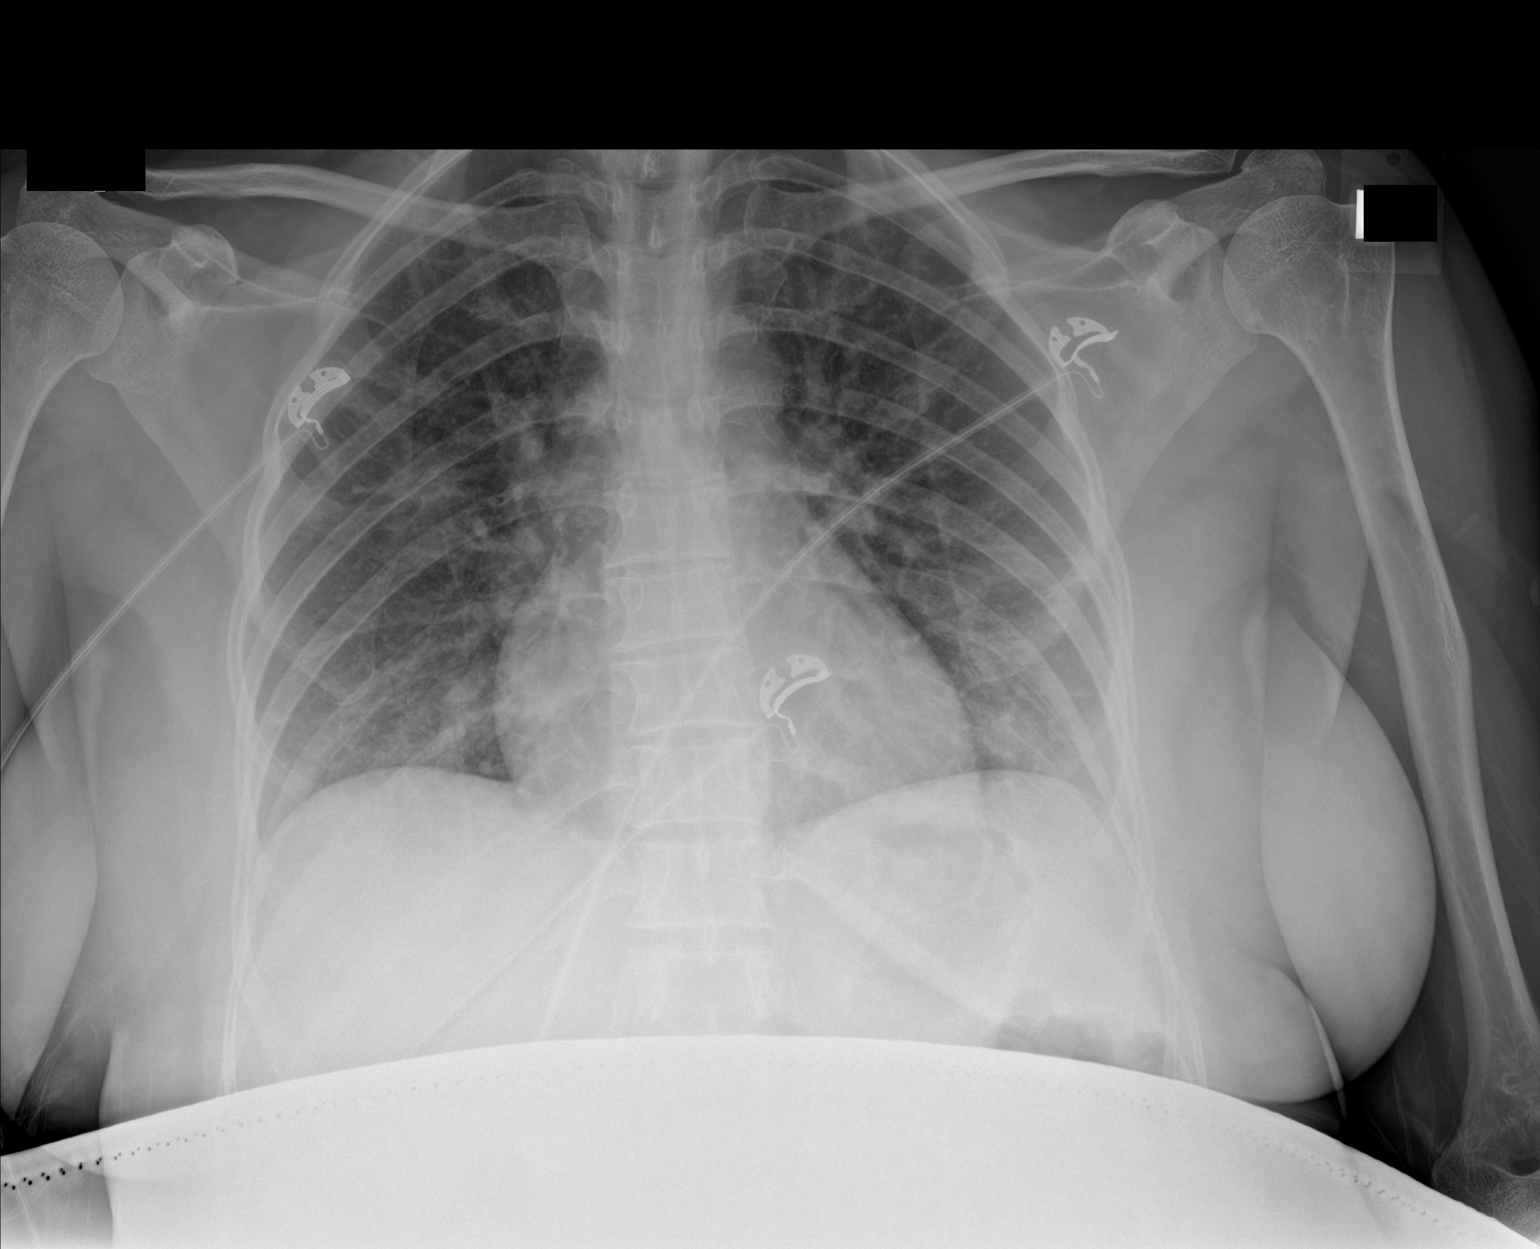

[1 of 1 positions shown; findings below may reference images not displayed]

FINDINGS: Portable AP upright view at 5053 hours. Normal lung volumes and
mediastinal contours. Visualized tracheal air column is within
normal limits. Patchy and indistinct multifocal pulmonary opacity,
more extensive in the right lung. No areas of consolidation. No
pneumothorax, pleural effusion or pulmonary edema.

The patient's abdomen was shielded. Negative visible bowel gas
pattern. No acute osseous abnormality identified.
IMPRESSION: Multifocal indistinct bilateral pulmonary opacity suspicious for
acute viral/air typical respiratory infection. No pleural effusion.

## 2021-04-21 IMAGING — US US OB TRANSVAGINAL
1 series · 13 of 28 positions shown · non-contrast
Comparison: none

CLINICAL DATA: 43-year-old pregnant female with vaginal bleeding,
presents for assessment of fetal growth, AFI and placental location.
Prior obstetric scans reportedly performed at outside facility.

EXAM:
OBSTETRICAL ULTRASOUND >14 WKS AND TRANSVAGINAL OB US

[Series 1: us ob transvaginal · 13 of 72 slices shown]
[im 3/72]
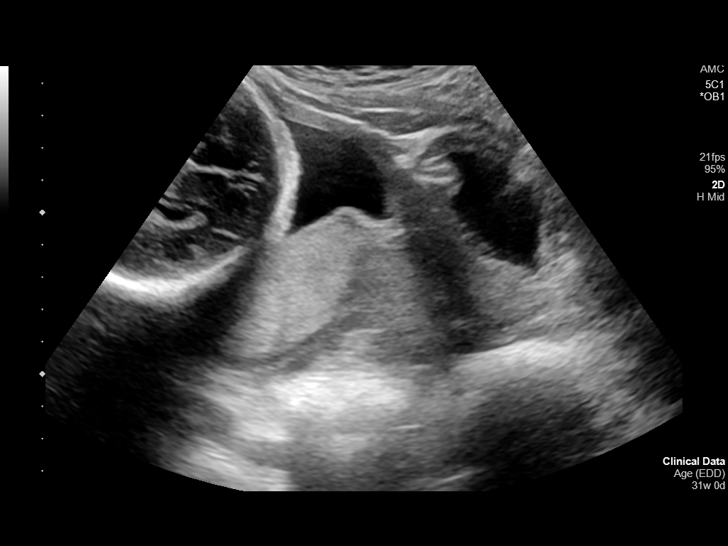
[im 8/72]
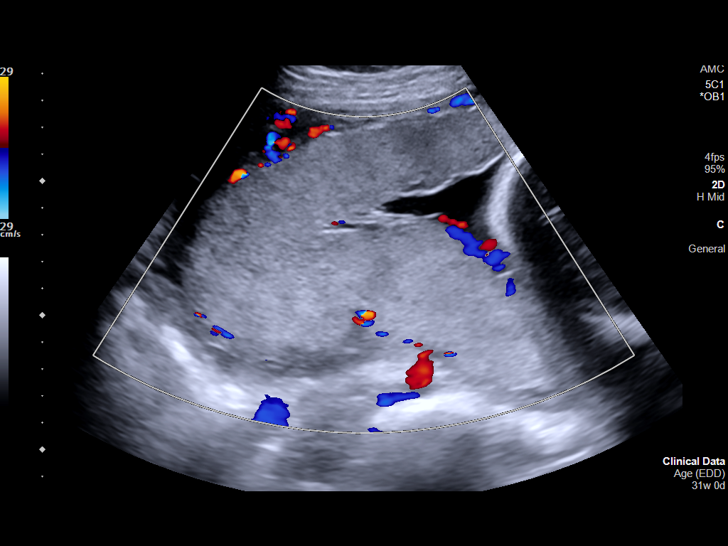
[im 14/72]
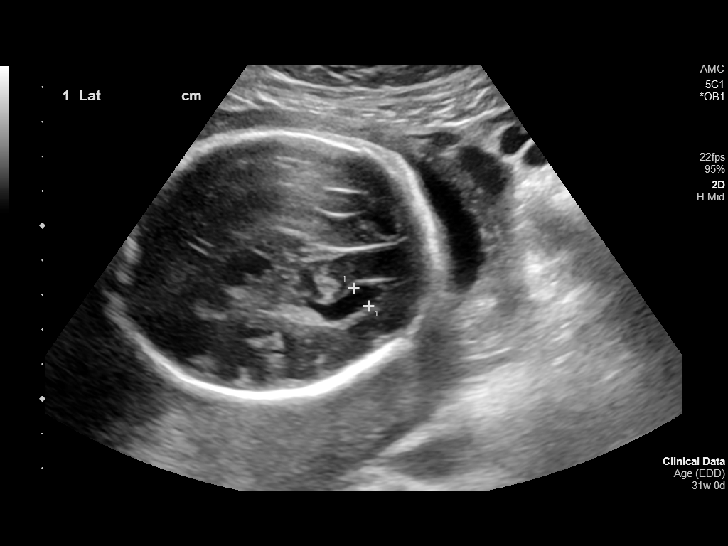
[im 19/72]
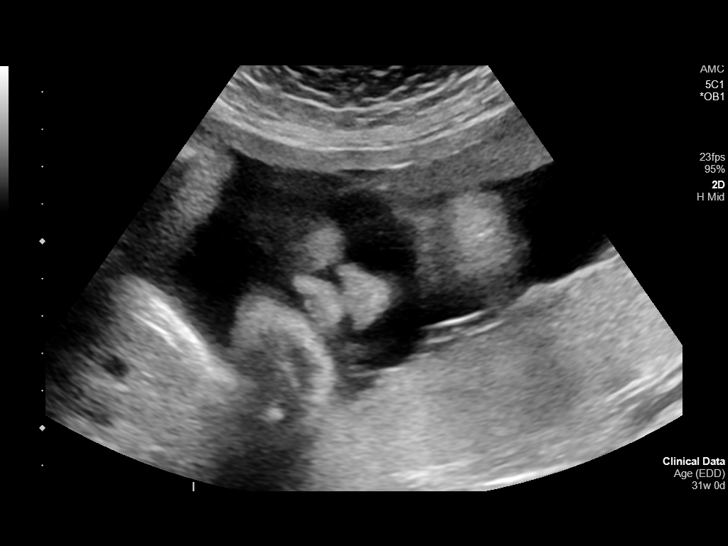
[im 24/72]
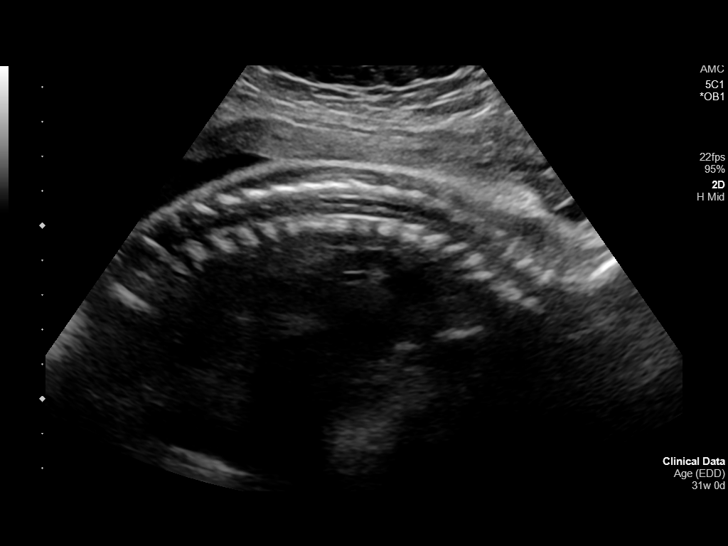
[im 29/72]
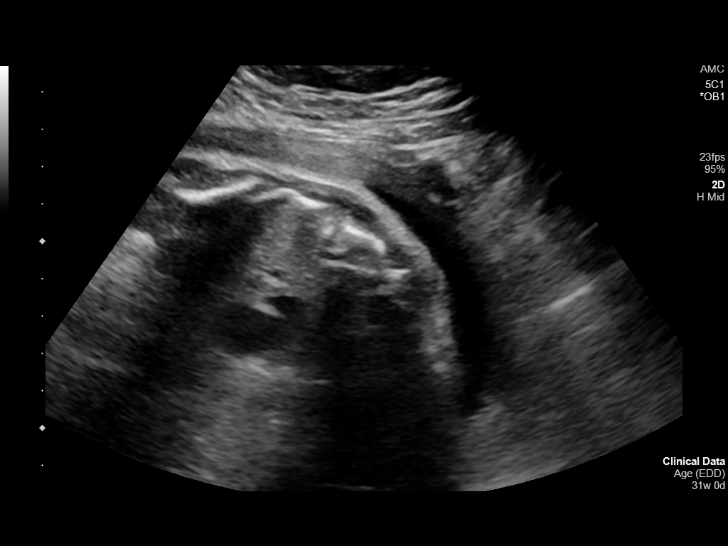
[im 37/72]
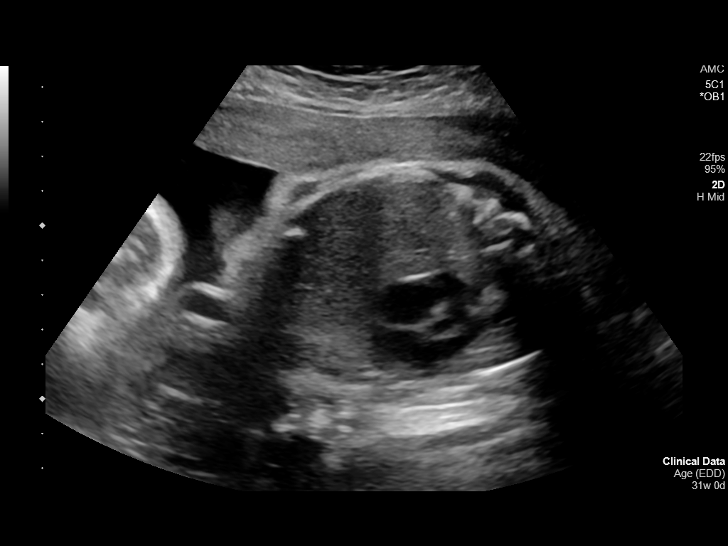
[im 43/72]
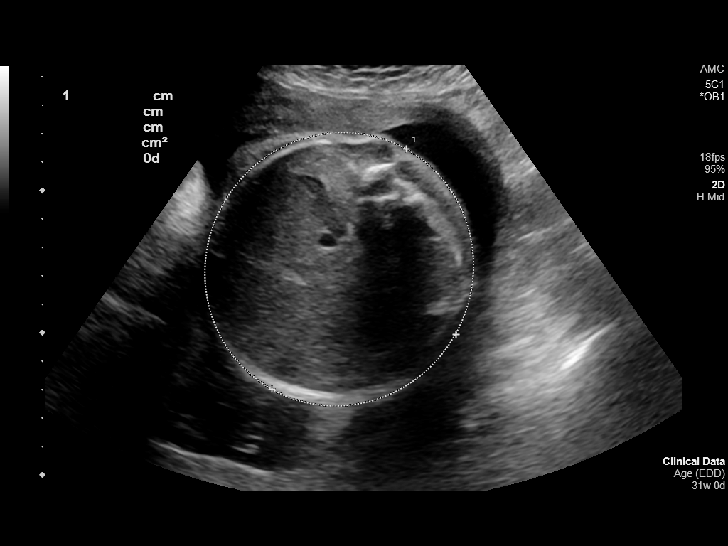
[im 48/72]
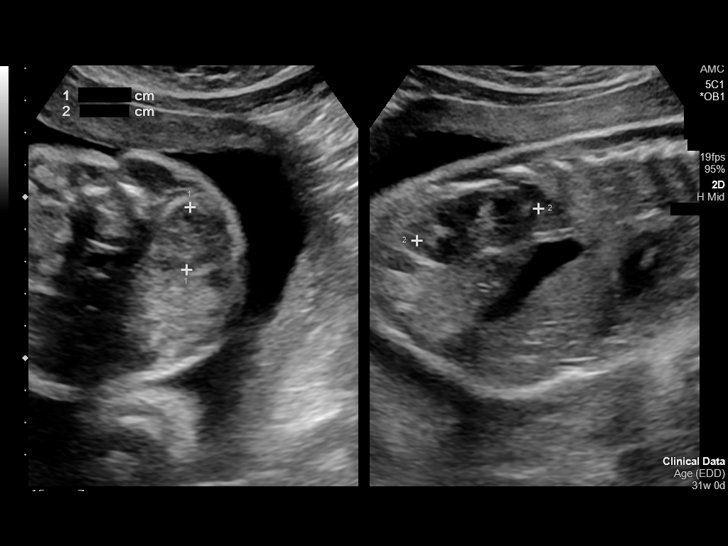
[im 53/72]
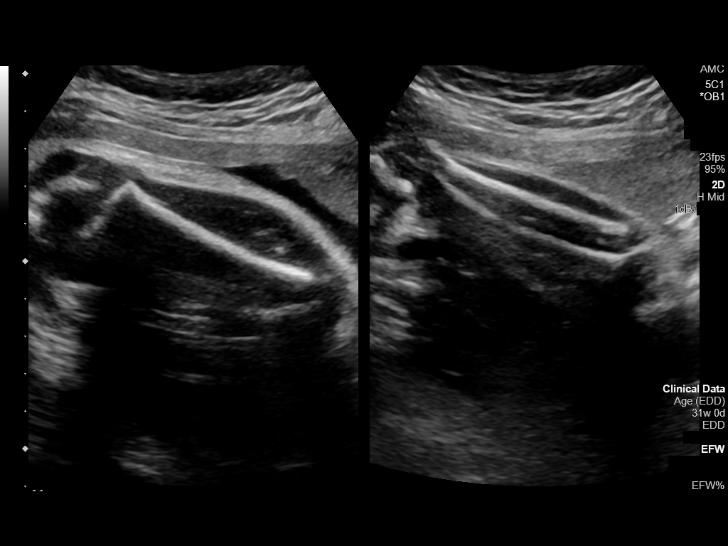
[im 58/72]
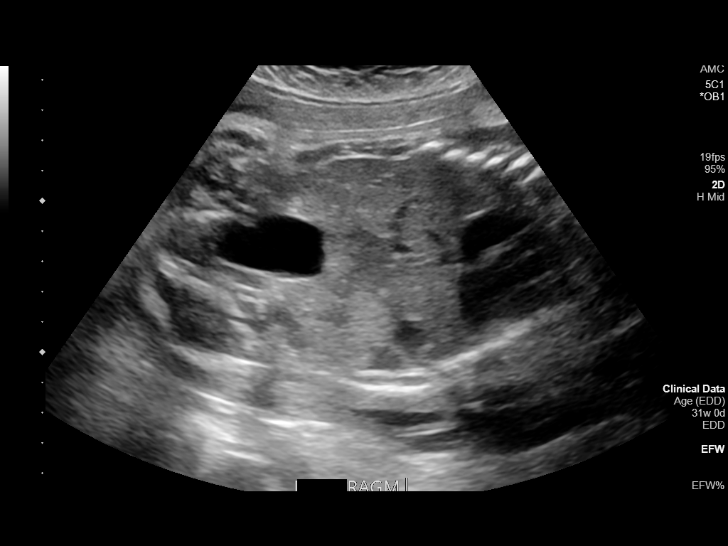
[im 64/72]
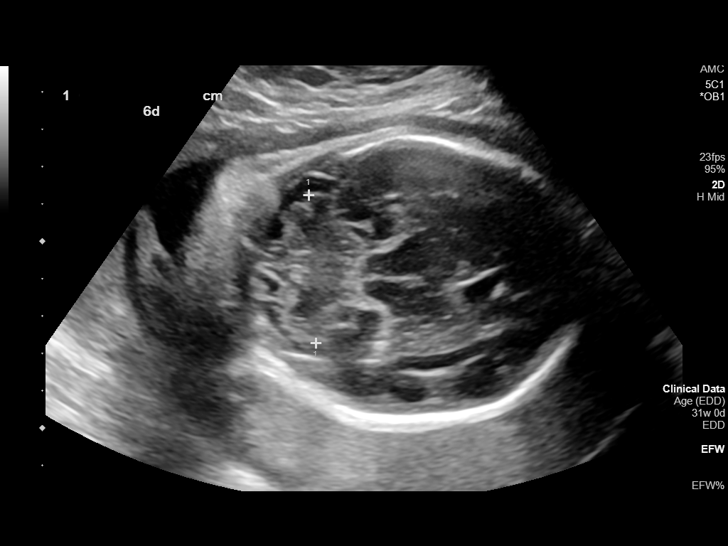
[im 69/72]
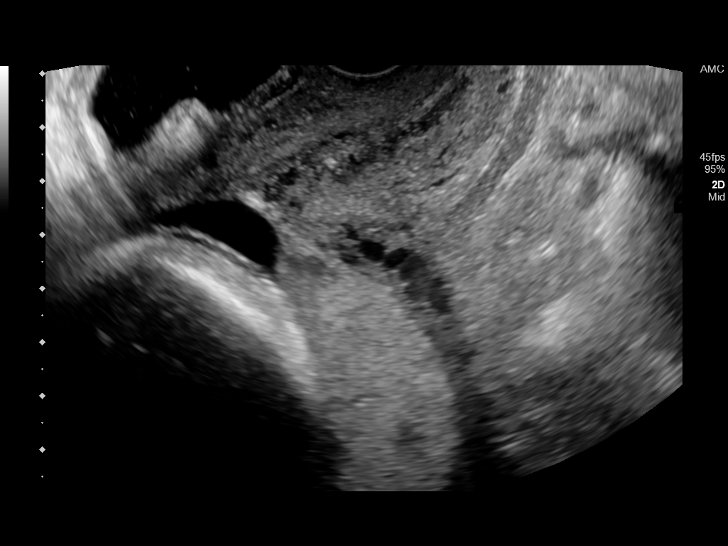

[13 of 28 positions shown; findings below may reference images not displayed]

FINDINGS: Number of Fetuses: 1

Heart Rate: 135 bpm

Movement: Yes

Presentation: Cephalic

Previa: Marginal posterior placenta previa, with inferior placental
tip located 0.9 cm from the internal cervical os on transvaginal
views.

Placental Location: Posterior right

Amniotic Fluid (Subjective): Normal

Amniotic Fluid (Objective):

Vertical pocket 6.9cm

AFI 16.2 cm (5%ile= 9.0 cm, 95%= 23.4 cm for 31 wks)

FETAL BIOMETRY

BPD:  7.6cm 30w 5d

HC:    28.7cm 31w 4d

AC:    29.9cm 33w 6d

FL:    5.9cm 30w 6d

Current Mean GA: 31w 5d US EDC: 04/27/2019

Assigned GA: 31w 0d Assigned EDC: 05/02/2019

Estimated Fetal Weight:  1,982g 85%ile

FETAL ANATOMY

Lateral Ventricles: Appears normal

Thalami/CSP: Appears normal

Posterior Fossa: Appears normal

Nuchal Region: Appears normal    NFT= N/A > 20 WKS

Upper Lip: Not well visualized

Spine: Appears normal

4 Chamber Heart on Left: Appears normal

LVOT: Appears normal

RVOT: Not well visualized

Stomach on Left: Appears normal

3 Vessel Cord: Appears normal

Cord Insertion site: Appears normal on limited views

Kidneys: Appears normal

Bladder: Appears normal

Extremities: Appears normal, 4 extremities demonstrated

Sex: Male external genitalia

Technical Limitations: Advanced gestational age

Maternal Findings:

Cervix: Cervix length 4.9 cm on transvaginal views with no evidence
of internal cervical funneling.
IMPRESSION: 1. Single living intrauterine gestation in cephalic lie at 31 weeks
5 days by average ultrasound age, concordant with provided dating.
2. Estimated fetal weight 6376 g, at the 85th percentile for
expected gestational age.
3. Normal amniotic fluid volume.  AFI 16.2.
4. Marginal posterior placenta previa.
5. Otherwise no fetal or maternal abnormalities detected, with
limitations as detailed.

## 2023-04-09 ENCOUNTER — Ambulatory Visit: Payer: Self-pay

## 2023-04-09 ENCOUNTER — Ambulatory Visit (LOCAL_COMMUNITY_HEALTH_CENTER): Payer: Self-pay

## 2023-04-09 DIAGNOSIS — Z23 Encounter for immunization: Secondary | ICD-10-CM

## 2023-04-09 DIAGNOSIS — Z719 Counseling, unspecified: Secondary | ICD-10-CM

## 2023-04-09 NOTE — Progress Notes (Signed)
 Patient seen in nurse clinic for immigration vaccines. Patient is not sure what is needed but had a list of vaccines the immigration services provided.  Tdap, COVID/Comirnaty 2024-25 given per [place] guidelines and patient paid for Flu and Polio vaccines. Tolerated well. VIS provided. NCIR updated and copies provided to patient.    Patient also needs MMR, Varicella and Hep B vaccine.  MMR will be at no cost per [place]IP guidelines.  [employer] application completed for Varicella and Hepatitis B vaccines.  [employer] application not approved at this time.  Letter needed from husband stating no income and financial responsible. Patient will be contacted by Amy Widderich regarding information needed and to reschedule.

## 2023-04-13 ENCOUNTER — Ambulatory Visit: Payer: Self-pay

## 2023-04-13 DIAGNOSIS — Z719 Counseling, unspecified: Secondary | ICD-10-CM

## 2023-04-13 DIAGNOSIS — Z23 Encounter for immunization: Secondary | ICD-10-CM

## 2023-04-13 NOTE — Progress Notes (Signed)
Client seen in nurse clinic for vaccine.   Client report history of maternity care at [facility].  Labs reviewed and varicella titer on file and varicella immune.  ROI signed by client and copy of Varicella Titer Results provided. No varicella vaccine administered today.   Client qualified for MMR per [place]IP, and [employer] approved for HEPB.   NCIR down, vaccines entered into EPIC, and Epic vaccine record provided. Client to come to  [facility] on  Tuesday 04/17/2023 to pick up copy of NCIR.   Stina Gane Sherrilyn Rist, RN
